# Patient Record
Sex: Female | Born: 1985 | Race: Black or African American | Hispanic: No | Marital: Single | Smoking: Current every day smoker
Health system: Southern US, Community
[De-identification: ages and names within clinical notes are randomized; demographics above are authoritative.]

## PROBLEM LIST (undated history)

## (undated) ENCOUNTER — Ambulatory Visit: Payer: Self-pay | Source: Home / Self Care

## (undated) DIAGNOSIS — F329 Major depressive disorder, single episode, unspecified: Secondary | ICD-10-CM

## (undated) DIAGNOSIS — F32A Depression, unspecified: Secondary | ICD-10-CM

---

## 2014-09-30 ENCOUNTER — Encounter (HOSPITAL_COMMUNITY): Payer: Self-pay | Admitting: Emergency Medicine

## 2014-09-30 ENCOUNTER — Emergency Department (HOSPITAL_COMMUNITY): Payer: Self-pay

## 2014-09-30 ENCOUNTER — Emergency Department (HOSPITAL_COMMUNITY)
Admission: EM | Admit: 2014-09-30 | Discharge: 2014-10-01 | Disposition: A | Discharge status: Home / Self Care | Payer: Self-pay | Attending: Emergency Medicine | Admitting: Emergency Medicine

## 2014-09-30 DIAGNOSIS — Y998 Other external cause status: Secondary | ICD-10-CM | POA: Insufficient documentation

## 2014-09-30 DIAGNOSIS — S82832A Other fracture of upper and lower end of left fibula, initial encounter for closed fracture: Secondary | ICD-10-CM | POA: Insufficient documentation

## 2014-09-30 DIAGNOSIS — S82402A Unspecified fracture of shaft of left fibula, initial encounter for closed fracture: Secondary | ICD-10-CM

## 2014-09-30 DIAGNOSIS — Y9351 Activity, roller skating (inline) and skateboarding: Secondary | ICD-10-CM | POA: Insufficient documentation

## 2014-09-30 DIAGNOSIS — Z880 Allergy status to penicillin: Secondary | ICD-10-CM | POA: Insufficient documentation

## 2014-09-30 DIAGNOSIS — Z8659 Personal history of other mental and behavioral disorders: Secondary | ICD-10-CM | POA: Insufficient documentation

## 2014-09-30 DIAGNOSIS — Y92331 Roller skating rink as the place of occurrence of the external cause: Secondary | ICD-10-CM | POA: Insufficient documentation

## 2014-09-30 HISTORY — DX: Depression, unspecified: F32.A

## 2014-09-30 HISTORY — DX: Major depressive disorder, single episode, unspecified: F32.9

## 2014-09-30 MED ORDER — IBUPROFEN 200 MG PO TABS
600.0000 mg | ORAL_TABLET | Freq: Once | ORAL | Status: AC
  Administered 2014-09-30: 600 mg via ORAL
  Filled 2014-09-30: qty 3

## 2014-09-30 NOTE — ED Notes (Signed)
Attempted to call ortho tech busy signal.

## 2014-09-30 NOTE — ED Notes (Addendum)
Spoke to [facility] about unable to contact ortho she reports that  They are here til 2330 and Tech at [facility] will need to be called. Secretary  Debbie called [facility].

## 2014-09-30 NOTE — ED Provider Notes (Addendum)
CSN: [id number]     Arrival date & time 09/30/14  2145 History  This chart was scribed for non-physician practitioner, Arman Filter, NP, working with Rolan Bucco, MD, by Budd Palmer ED Scribe. This patient was seen in room WTR6/WTR6 and the patient's care was started at 10:06 PM    Chief Complaint  Patient presents with  . Ankle Pain   The history is provided by the patient. No language interpreter was used.   HPI Comments: Diana Jimenez is a 29 y.o. female who presents to the Emergency Department complaining of left ankle pain onset after a fall 4-5 hours PTA. Pt states she was roller skating when she twisted the ankle and fell. She has applied ice and elevated the ankle. She has not taken any medication. She was unable to walk on it. She has sprained the ankle once before a age 76, when she was prescribed a brace and boot which she wore for a week.  Past Medical History  Diagnosis Date  . Depressed    History reviewed. No pertinent past surgical history. History reviewed. No pertinent family history. History  Substance Use Topics  . Smoking status: Not on file  . Smokeless tobacco: Not on file  . Alcohol Use: Not on file   OB History    No data available     Review of Systems  Allergies  Haldol and Penicillins  Home Medications   Prior to Admission medications   Medication Sig Start Date End Date Taking? Authorizing Provider  HYDROcodone-acetaminophen (NORCO/VICODIN) 5-325 MG per tablet Take 2 tablets by mouth every 4 (four) hours as needed. 10/01/14   Earley Favor, NP  ibuprofen (ADVIL,MOTRIN) 200 MG tablet Take 800 mg by mouth every 6 (six) hours as needed for moderate pain.   Yes Historical Provider, MD   BP 144/73 mmHg  Pulse 72  Temp(Src) 99 F (37.2 C) (Oral)  Resp 18  SpO2 95%  LMP 09/22/2014 Physical Exam  Constitutional: She is oriented to person, place, and time. She appears well-developed and well-nourished.  HENT:  Head: Normocephalic.  Eyes:  Pupils are equal, round, and reactive to light.  Neck: Normal range of motion.  Cardiovascular: Normal rate.   Musculoskeletal: She exhibits tenderness. She exhibits no edema.       Feet:  Neurological: She is alert and oriented to person, place, and time.  Skin: Skin is warm.  Nursing note and vitals reviewed.   ED Course  Procedures  DIAGNOSTIC STUDIES: Oxygen Saturation is 95% on RA, adequate by my interpretation.    COORDINATION OF CARE: 10:08 PM - Discussed plans to order diagnostic imaging. Pt advised of plan for treatment and pt agrees.  Labs Review Labs Reviewed - No data to display  Imaging Review Dg Ankle Complete Left  09/30/2014   CLINICAL DATA:  Trauma. Fall while roller-skating this afternoon. Pain and swelling laterally.  EXAM: LEFT ANKLE COMPLETE - 3+ VIEW  COMPARISON:  None.  FINDINGS: There is a mildly displaced oblique fracture of the distal fibula at the level of the tibial talar joint. No associated medial malleolar posterior tibial tubercle fracture. No widening of the medial clear space, mortise is preserved. Lateral soft tissue edema is seen.  IMPRESSION: Mildly displaced distal fibular fracture.   Electronically Signed   By: Rubye Oaks M.D.   On: 09/30/2014 23:17     EKG Interpretation None    i spoke with Dr. Annell Greening concerning the fracture that extends into the joint and  the mildly displaced status.  He states he would like a well-padded posterior splint, crutches, nonweightbearing and have the patient be seen by him in the office tomorrow.  This most likely is going to need a surgical plate for repair Patient has been placed in a Jones wrap with posterior splint, provided with crutches, orthopedic Tech taught proper crutch walking.  Patient demonstrated this back without difficulty.  She understands that she is to follow-up with Dr. Ophelia Charter in the morning  MDM   Final diagnoses:  Closed fibular fracture, left, initial encounter    I personally  performed the services described in this documentation, which was scribed in my presence. The recorded information has been reviewed and is accurate.  Earley Favor, NP 10/01/14 4098  Devoria Albe, MD 10/01/14 1191  Earley Favor, NP 10/21/14 4782  Devoria Albe, MD 10/27/14 2300

## 2014-09-30 NOTE — ED Notes (Signed)
Skating tonight, fell, injured left ankle. Moderate swelling observed, no obvious deformities.

## 2014-10-01 MED ORDER — HYDROCODONE-ACETAMINOPHEN 5-325 MG PO TABS
1.0000 | ORAL_TABLET | Freq: Once | ORAL | Status: AC
  Administered 2014-10-01: 1 via ORAL
  Filled 2014-10-01: qty 1

## 2014-10-01 MED ORDER — HYDROCODONE-ACETAMINOPHEN 5-325 MG PO TABS: 2.0000 | ORAL_TABLET | ORAL | Status: DC | PRN

## 2014-10-01 NOTE — ED Notes (Signed)
Pt significant other driving her home. She was advised to follow up with Dr. Ophelia Charter in the morning.

## 2014-10-01 NOTE — Discharge Instructions (Signed)
Cast or Splint Care °Casts and splints support injured limbs and keep bones from moving while they heal. It is important to care for your cast or splint at home.   °HOME CARE INSTRUCTIONS °· Keep the cast or splint uncovered during the drying period. It can take 24 to 48 hours to dry if it is made of plaster. A fiberglass cast will dry in less than 1 hour. °· Do not rest the cast on anything harder than a pillow for the first 24 hours. °· Do not put weight on your injured limb or apply pressure to the cast until your health care provider gives you permission. °· Keep the cast or splint dry. Wet casts or splints can lose their shape and may not support the limb as well. A wet cast that has lost its shape can also create harmful pressure on your skin when it dries. Also, wet skin can become infected. °¨ Cover the cast or splint with a plastic bag when bathing or when out in the rain or snow. If the cast is on the trunk of the body, take sponge baths until the cast is removed. °¨ If your cast does become wet, dry it with a towel or a blow dryer on the cool setting only. °· Keep your cast or splint clean. Soiled casts may be wiped with a moistened cloth. °· Do not place any hard or soft foreign objects under your cast or splint, such as cotton, toilet paper, lotion, or powder. °· Do not try to scratch the skin under the cast with any object. The object could get stuck inside the cast. Also, scratching could lead to an infection. If itching is a problem, use a blow dryer on a cool setting to relieve discomfort. °· Do not trim or cut your cast or remove padding from inside of it. °· Exercise all joints next to the injury that are not immobilized by the cast or splint. For example, if you have a long leg cast, exercise the hip joint and toes. If you have an arm cast or splint, exercise the shoulder, elbow, thumb, and fingers. °· Elevate your injured arm or leg on 1 or 2 pillows for the first 1 to 3 days to decrease  swelling and pain. It is best if you can comfortably elevate your cast so it is higher than your heart. °SEEK MEDICAL CARE IF:  °· Your cast or splint cracks. °· Your cast or splint is too tight or too loose. °· You have unbearable itching inside the cast. °· Your cast becomes wet or develops a soft spot or area. °· You have a bad smell coming from inside your cast. °· You get an object stuck under your cast. °· Your skin around the cast becomes red or raw. °· You have new pain or worsening pain after the cast has been applied. °SEEK IMMEDIATE MEDICAL CARE IF:  °· You have fluid leaking through the cast. °· You are unable to move your fingers or toes. °· You have discolored (blue or white), cool, painful, or very swollen fingers or toes beyond the cast. °· You have tingling or numbness around the injured area. °· You have severe pain or pressure under the cast. °· You have any difficulty with your breathing or have shortness of breath. °· You have chest pain. °Document Released: 02/17/2000 Document Revised: 12/10/2012 Document Reviewed: 08/28/2012 °ExitCare® Patient Information ©2015 [employer]. This information is not intended to replace advice given to you by your health care   provider. Make sure you discuss any questions you have with your health care provider. As discussed, you have a fracture.  That's very close to or into the joint making your ankle unstable.  Tonight, you have been placed in a splint and given crutches, you are NOT to bear weight on your left foot  I discussed this with Dr. Annell Greening-  He would like to see you in the office in the morning.  Please call between 8:30 and 9 to ascertain a time. This injury will most likely require surgery for repair

## 2014-10-01 NOTE — ED Notes (Signed)
Oroth at bedside.

## 2014-10-01 NOTE — ED Notes (Signed)
[facility] Bill called to report he will be on his way.

## 2014-10-20 ENCOUNTER — Encounter (HOSPITAL_COMMUNITY)
Admission: RE | Admit: 2014-10-20 | Discharge: 2014-10-20 | Disposition: A | Discharge status: Home / Self Care | Payer: Self-pay | Source: Ambulatory Visit | Attending: Orthopaedic Surgery | Admitting: Orthopaedic Surgery

## 2014-10-20 ENCOUNTER — Ambulatory Visit (HOSPITAL_COMMUNITY)
Admission: RE | Admit: 2014-10-20 | Discharge: 2014-10-20 | Disposition: A | Discharge status: Home / Self Care | Payer: Self-pay | Source: Ambulatory Visit | Attending: Surgery | Admitting: Surgery

## 2014-10-20 DIAGNOSIS — Z01818 Encounter for other preprocedural examination: Secondary | ICD-10-CM

## 2014-10-20 DIAGNOSIS — Z01812 Encounter for preprocedural laboratory examination: Secondary | ICD-10-CM | POA: Insufficient documentation

## 2014-10-20 DIAGNOSIS — I491 Atrial premature depolarization: Secondary | ICD-10-CM | POA: Insufficient documentation

## 2014-10-20 LAB — COMPREHENSIVE METABOLIC PANEL
ALT: 10 U/L — AB (ref 14–54)
ANION GAP: 8 (ref 5–15)
AST: 14 U/L — ABNORMAL LOW (ref 15–41)
Albumin: 4 g/dL (ref 3.5–5.0)
Alkaline Phosphatase: 70 U/L (ref 38–126)
BUN: 7 mg/dL (ref 6–20)
CHLORIDE: 105 mmol/L (ref 101–111)
CO2: 23 mmol/L (ref 22–32)
Calcium: 9.2 mg/dL (ref 8.9–10.3)
Creatinine, Ser: 0.81 mg/dL (ref 0.44–1.00)
GFR calc non Af Amer: >60 mL/min (ref >60)
Glucose, Bld: 98 mg/dL (ref 65–99)
Potassium: 3.8 mmol/L (ref 3.5–5.1)
SODIUM: 136 mmol/L (ref 135–145)
Total Bilirubin: 0.5 mg/dL (ref 0.3–1.2)
Total Protein: 7.3 g/dL (ref 6.5–8.1)

## 2014-10-20 LAB — CBC
HCT: 42.9 % (ref 36.0–46.0)
Hemoglobin: 14.9 g/dL (ref 12.0–15.0)
MCH: 33.1 pg (ref 26.0–34.0)
MCHC: 34.7 g/dL (ref 30.0–36.0)
MCV: 95.3 fL (ref 78.0–100.0)
Platelets: 237 10*3/uL (ref 150–400)
RBC: 4.5 MIL/uL (ref 3.87–5.11)
RDW: 12.7 % (ref 11.5–15.5)
WBC: 12.7 10*3/uL — ABNORMAL HIGH (ref 4.0–10.5)

## 2014-10-20 LAB — PROTIME-INR
INR: 1.12 (ref 0.00–1.49)
Prothrombin Time: 14.6 seconds (ref 11.6–15.2)

## 2014-10-20 LAB — SURGICAL PCR SCREEN
MRSA, PCR: NEGATIVE
Staphylococcus aureus: NEGATIVE

## 2014-10-20 LAB — APTT: aPTT: 32 seconds (ref 24–37)

## 2014-10-20 LAB — HCG, SERUM, QUALITATIVE: Preg, Serum: NEGATIVE

## 2014-10-20 NOTE — Progress Notes (Signed)
Pt denies SOB, chest pain, and being under the care of a cardiologist. Pt denies having a stress test, echo and cardiac cath. Pt denies having a chest x ray and EKG within the last year. 

## 2014-10-20 NOTE — Pre-Procedure Instructions (Signed)
Diana Jimenez  10/20/2014      [id number] [name], [name] - [name] [place] [address] Phone: [phone] Fax: [phone]    Your procedure is scheduled on Monday, October 25, 2014.  Report to [facility] Admitting at 1:45  P.M.  Call this number if you have problems the morning of surgery:  [phone]   Remember:  Do not eat food or drink liquids after midnight .  Wear  clean pajamas.            11.  Place clean sheets on your bed the night of your first shower and do not sleep with pets.  Day of Surgery  Do not apply any lotions/deodorants the morning of surgery.  Please wear clean clothes to the hospital/surgery center.  Please read over the following fact sheets that you were given. Pain Booklet, Coughing and Deep Breathing, MRSA Information and Surgical Site Infection Prevention

## 2014-10-21 NOTE — Progress Notes (Signed)
Anesthesia Chart Review:  Pt is 29 year old female scheduled for ORIF L fibula fracture on 10/25/2014 with Dr. Ophelia Charter.   PMH includes: depression. BMI 22. Smoking status not assessed.   Preoperative labs reviewed.    Chest x-ray 10/20/2014 reviewed. No active cardiopulmonary disease.   EKG 10/20/2014: Sinus rhythm with PACs. Early repolarization.  If no changes, I anticipate pt can proceed with surgery as scheduled.   Rica Mast, FNP-BC [facility]/Anesthesiology Phone: [phone] 10/21/2014 1:24 PM

## 2014-10-22 MED ORDER — CHLORHEXIDINE GLUCONATE 4 % EX LIQD: 60.0000 mL | Freq: Once | CUTANEOUS | Status: DC

## 2014-10-22 MED ORDER — VANCOMYCIN HCL IN DEXTROSE 1-5 GM/200ML-% IV SOLN
1000.0000 mg | INTRAVENOUS | Status: AC
  Administered 2014-10-25: 1000 mg via INTRAVENOUS
  Filled 2014-10-22: qty 200

## 2014-10-25 ENCOUNTER — Encounter (HOSPITAL_COMMUNITY)
Admission: RE | Disposition: A | Discharge status: Home / Self Care | Payer: Self-pay | Source: Ambulatory Visit | Attending: Orthopaedic Surgery

## 2014-10-25 ENCOUNTER — Ambulatory Visit (HOSPITAL_COMMUNITY)
Admission: RE | Admit: 2014-10-25 | Discharge: 2014-10-25 | Disposition: A | Discharge status: Home / Self Care | Payer: Self-pay | Source: Ambulatory Visit | Attending: Orthopaedic Surgery | Admitting: Orthopaedic Surgery

## 2014-10-25 ENCOUNTER — Ambulatory Visit (HOSPITAL_COMMUNITY): Payer: Self-pay | Admitting: Emergency Medicine

## 2014-10-25 ENCOUNTER — Encounter (HOSPITAL_COMMUNITY): Payer: Self-pay | Admitting: Surgery

## 2014-10-25 ENCOUNTER — Ambulatory Visit (HOSPITAL_COMMUNITY): Payer: Self-pay | Admitting: Anesthesiology

## 2014-10-25 DIAGNOSIS — F17210 Nicotine dependence, cigarettes, uncomplicated: Secondary | ICD-10-CM | POA: Insufficient documentation

## 2014-10-25 DIAGNOSIS — X58XXXA Exposure to other specified factors, initial encounter: Secondary | ICD-10-CM | POA: Insufficient documentation

## 2014-10-25 DIAGNOSIS — S8262XA Displaced fracture of lateral malleolus of left fibula, initial encounter for closed fracture: Secondary | ICD-10-CM | POA: Insufficient documentation

## 2014-10-25 DIAGNOSIS — F329 Major depressive disorder, single episode, unspecified: Secondary | ICD-10-CM | POA: Insufficient documentation

## 2014-10-25 DIAGNOSIS — Z880 Allergy status to penicillin: Secondary | ICD-10-CM | POA: Insufficient documentation

## 2014-10-25 DIAGNOSIS — Z885 Allergy status to narcotic agent status: Secondary | ICD-10-CM | POA: Insufficient documentation

## 2014-10-25 HISTORY — PX: ORIF FIBULA FRACTURE: SHX5114

## 2014-10-25 SURGERY — OPEN REDUCTION INTERNAL FIXATION (ORIF) FIBULA FRACTURE
Anesthesia: General | Site: Leg Lower | Laterality: Left

## 2014-10-25 MED ORDER — OXYCODONE HCL 5 MG/5ML PO SOLN: 5.0000 mg | Freq: Once | ORAL | Status: AC | PRN

## 2014-10-25 MED ORDER — OXYCODONE HCL 5 MG PO TABS
5.0000 mg | ORAL_TABLET | Freq: Once | ORAL | Status: AC | PRN
  Administered 2014-10-25: 5 mg via ORAL

## 2014-10-25 MED ORDER — LIDOCAINE HCL (CARDIAC) 20 MG/ML IV SOLN
INTRAVENOUS | Status: DC | PRN
  Administered 2014-10-25: 40 mg via INTRAVENOUS

## 2014-10-25 MED ORDER — DEXAMETHASONE SODIUM PHOSPHATE 4 MG/ML IJ SOLN
INTRAMUSCULAR | Status: DC | PRN
Start: 2014-10-25 — End: 2014-10-25
  Administered 2014-10-25: 4 mg via INTRAVENOUS

## 2014-10-25 MED ORDER — OXYCODONE-ACETAMINOPHEN 5-325 MG PO TABS: 2.0000 | ORAL_TABLET | ORAL | Status: DC | PRN

## 2014-10-25 MED ORDER — FENTANYL CITRATE (PF) 100 MCG/2ML IJ SOLN
25.0000 ug | INTRAMUSCULAR | Status: DC | PRN
  Administered 2014-10-25 (×4): 25 ug via INTRAVENOUS

## 2014-10-25 MED ORDER — FENTANYL CITRATE (PF) 100 MCG/2ML IJ SOLN
INTRAMUSCULAR | Status: DC | PRN
  Administered 2014-10-25 (×2): 50 ug via INTRAVENOUS
  Administered 2014-10-25: 25 ug via INTRAVENOUS
  Administered 2014-10-25: 50 ug via INTRAVENOUS
  Administered 2014-10-25: 25 ug via INTRAVENOUS
  Administered 2014-10-25: 50 ug via INTRAVENOUS

## 2014-10-25 MED ORDER — PROPOFOL 10 MG/ML IV BOLUS
INTRAVENOUS | Status: DC | PRN
  Administered 2014-10-25: 180 mg via INTRAVENOUS

## 2014-10-25 MED ORDER — MIDAZOLAM HCL 2 MG/2ML IJ SOLN
INTRAMUSCULAR | Status: AC
  Administered 2014-10-25: 2 mg
  Filled 2014-10-25: qty 2

## 2014-10-25 MED ORDER — FENTANYL CITRATE (PF) 100 MCG/2ML IJ SOLN
INTRAMUSCULAR | Status: AC
  Administered 2014-10-25: 100 ug
  Filled 2014-10-25: qty 2

## 2014-10-25 MED ORDER — MIDAZOLAM HCL 2 MG/2ML IJ SOLN
INTRAMUSCULAR | Status: AC
  Filled 2014-10-25: qty 2

## 2014-10-25 MED ORDER — 0.9 % SODIUM CHLORIDE (POUR BTL) OPTIME
TOPICAL | Status: DC | PRN
  Administered 2014-10-25: 1000 mL

## 2014-10-25 MED ORDER — GLYCOPYRROLATE 0.2 MG/ML IJ SOLN
INTRAMUSCULAR | Status: DC | PRN
  Administered 2014-10-25: 0.2 mg via INTRAVENOUS

## 2014-10-25 MED ORDER — FENTANYL CITRATE (PF) 250 MCG/5ML IJ SOLN
INTRAMUSCULAR | Status: AC
  Filled 2014-10-25: qty 5

## 2014-10-25 MED ORDER — BUPIVACAINE HCL (PF) 0.25 % IJ SOLN
INTRAMUSCULAR | Status: AC
  Filled 2014-10-25: qty 30

## 2014-10-25 MED ORDER — BUPIVACAINE HCL (PF) 0.25 % IJ SOLN
INTRAMUSCULAR | Status: DC | PRN
  Administered 2014-10-25: 10 mL

## 2014-10-25 MED ORDER — LIDOCAINE HCL (CARDIAC) 20 MG/ML IV SOLN
INTRAVENOUS | Status: AC
  Filled 2014-10-25: qty 5

## 2014-10-25 MED ORDER — ONDANSETRON HCL 4 MG/2ML IJ SOLN
INTRAMUSCULAR | Status: AC
  Filled 2014-10-25: qty 2

## 2014-10-25 MED ORDER — ONDANSETRON HCL 4 MG/2ML IJ SOLN: 4.0000 mg | Freq: Once | INTRAMUSCULAR | Status: DC | PRN

## 2014-10-25 MED ORDER — ONDANSETRON HCL 4 MG/2ML IJ SOLN
INTRAMUSCULAR | Status: DC | PRN
Start: 2014-10-25 — End: 2014-10-25
  Administered 2014-10-25: 4 mg via INTRAVENOUS

## 2014-10-25 MED ORDER — FENTANYL CITRATE (PF) 100 MCG/2ML IJ SOLN
INTRAMUSCULAR | Status: AC
  Filled 2014-10-25: qty 2

## 2014-10-25 MED ORDER — LACTATED RINGERS IV SOLN
INTRAVENOUS | Status: DC
  Administered 2014-10-25 (×2): via INTRAVENOUS

## 2014-10-25 MED ORDER — GLYCOPYRROLATE 0.2 MG/ML IJ SOLN
INTRAMUSCULAR | Status: AC
  Filled 2014-10-25: qty 1

## 2014-10-25 MED ORDER — OXYCODONE HCL 5 MG PO TABS
ORAL_TABLET | ORAL | Status: AC
  Filled 2014-10-25: qty 1

## 2014-10-25 SURGICAL SUPPLY — 63 items
BANDAGE ELASTIC 4 VELCRO ST LF (GAUZE/BANDAGES/DRESSINGS) IMPLANT
BANDAGE ELASTIC 6 VELCRO ST LF (GAUZE/BANDAGES/DRESSINGS) ×3 IMPLANT
BANDAGE ESMARK 6X9 LF (GAUZE/BANDAGES/DRESSINGS) ×1 IMPLANT
BIT DRILL 110X2.5XQCK CNCT (BIT) ×1 IMPLANT
BIT DRILL 2.5 (BIT) ×2
BIT DRL 110X2.5XQCK CNCT (BIT) ×1
BNDG ESMARK 6X9 LF (GAUZE/BANDAGES/DRESSINGS) ×3
BNDG GAUZE ELAST 4 BULKY (GAUZE/BANDAGES/DRESSINGS) IMPLANT
COVER MAYO STAND STRL (DRAPES) IMPLANT
COVER SURGICAL LIGHT HANDLE (MISCELLANEOUS) ×3 IMPLANT
CUFF TOURNIQUET SINGLE 34IN LL (TOURNIQUET CUFF) ×3 IMPLANT
CUFF TOURNIQUET SINGLE 44IN (TOURNIQUET CUFF) IMPLANT
DRAPE C-ARM 42X72 X-RAY (DRAPES) IMPLANT
DRAPE INCISE IOBAN 66X45 STRL (DRAPES) IMPLANT
DRAPE OEC MINIVIEW 54X84 (DRAPES) ×3 IMPLANT
DRAPE PROXIMA HALF (DRAPES) ×3 IMPLANT
DRAPE U-SHAPE 47X51 STRL (DRAPES) ×3 IMPLANT
DRSG PAD ABDOMINAL 8X10 ST (GAUZE/BANDAGES/DRESSINGS) ×3 IMPLANT
DURAPREP 26ML APPLICATOR (WOUND CARE) ×3 IMPLANT
ELECT REM PT RETURN 9FT ADLT (ELECTROSURGICAL) ×3
ELECTRODE REM PT RTRN 9FT ADLT (ELECTROSURGICAL) ×1 IMPLANT
GAUZE SPONGE 4X4 12PLY STRL (GAUZE/BANDAGES/DRESSINGS) ×3 IMPLANT
GAUZE XEROFORM 5X9 LF (GAUZE/BANDAGES/DRESSINGS) ×3 IMPLANT
GLOVE BIO SURGEON STRL SZ7 (GLOVE) ×9 IMPLANT
GLOVE BIOGEL PI IND STRL 6.5 (GLOVE) ×3 IMPLANT
GLOVE BIOGEL PI IND STRL 8 (GLOVE) ×2 IMPLANT
GLOVE BIOGEL PI INDICATOR 6.5 (GLOVE) ×6
GLOVE BIOGEL PI INDICATOR 8 (GLOVE) ×4
GLOVE ORTHO TXT STRL SZ7.5 (GLOVE) ×6 IMPLANT
GLOVE SURG SS PI 8.0 STRL IVOR (GLOVE) ×9 IMPLANT
GOWN STRL REUS W/ TWL LRG LVL3 (GOWN DISPOSABLE) ×3 IMPLANT
GOWN STRL REUS W/ TWL XL LVL3 (GOWN DISPOSABLE) ×3 IMPLANT
GOWN STRL REUS W/TWL 2XL LVL3 (GOWN DISPOSABLE) ×3 IMPLANT
GOWN STRL REUS W/TWL LRG LVL3 (GOWN DISPOSABLE) ×6
GOWN STRL REUS W/TWL XL LVL3 (GOWN DISPOSABLE) ×6
KIT BASIN OR (CUSTOM PROCEDURE TRAY) ×3 IMPLANT
KIT ROOM TURNOVER OR (KITS) ×3 IMPLANT
MANIFOLD NEPTUNE II (INSTRUMENTS) ×3 IMPLANT
NS IRRIG 1000ML POUR BTL (IV SOLUTION) ×3 IMPLANT
PACK ORTHO EXTREMITY (CUSTOM PROCEDURE TRAY) ×3 IMPLANT
PAD ARMBOARD 7.5X6 YLW CONV (MISCELLANEOUS) ×6 IMPLANT
PAD CAST 4YDX4 CTTN HI CHSV (CAST SUPPLIES) ×1 IMPLANT
PADDING CAST COTTON 4X4 STRL (CAST SUPPLIES) ×2
PADDING CAST COTTON 6X4 STRL (CAST SUPPLIES) ×3 IMPLANT
PLATE 7HOLE 1/3 TUBULAR (Plate) ×3 IMPLANT
SCREW CANC 2.5XFT 12X4XST SM (Screw) ×2 IMPLANT
SCREW CANC 4.0X12 (Screw) ×4 IMPLANT
SCREW CORTICAL 3.5 16MM (Screw) ×3 IMPLANT
SCREW CORTICAL 3.5X14 (Screw) ×9 IMPLANT
SCREW CORTICAL 3.5X26 (Screw) ×3 IMPLANT
SPONGE GAUZE 4X4 12PLY STER LF (GAUZE/BANDAGES/DRESSINGS) ×3 IMPLANT
SPONGE LAP 18X18 X RAY DECT (DISPOSABLE) ×3 IMPLANT
STAPLER VISISTAT 35W (STAPLE) IMPLANT
SUCTION FRAZIER TIP 10 FR DISP (SUCTIONS) ×3 IMPLANT
SUT ETHILON 3 0 PS 1 (SUTURE) ×6 IMPLANT
SUT VIC AB 2-0 CT1 27 (SUTURE) ×4
SUT VIC AB 2-0 CT1 TAPERPNT 27 (SUTURE) ×2 IMPLANT
TOWEL OR 17X24 6PK STRL BLUE (TOWEL DISPOSABLE) ×3 IMPLANT
TOWEL OR 17X26 10 PK STRL BLUE (TOWEL DISPOSABLE) ×3 IMPLANT
TUBE CONNECTING 12'X1/4 (SUCTIONS) ×1
TUBE CONNECTING 12X1/4 (SUCTIONS) ×2 IMPLANT
WATER STERILE IRR 1000ML POUR (IV SOLUTION) IMPLANT
YANKAUER SUCT BULB TIP NO VENT (SUCTIONS) ×3 IMPLANT

## 2014-10-25 NOTE — Anesthesia Procedure Notes (Addendum)
Anesthesia Regional Block:  Popliteal block  Pre-Anesthetic Checklist: ,, timeout performed, Correct Patient, Correct Site, Correct Laterality, Correct Procedure, Correct Position, site marked, Risks and benefits discussed,  Surgical consent,  Pre-op evaluation,  At surgeon's request and post-op pain management  Laterality: Left  Prep: Maximum Sterile Barrier Precautions used and chloraprep       Needles:  Injection technique: Single-shot  Needle Type: Echogenic Stimulator Needle     Needle Length: 9cm 9 cm Needle Gauge: 21 and 21 G    Additional Needles:  Procedures: ultrasound guided (picture in chart) and nerve stimulator Popliteal block Narrative:  Start time: 10/25/2014 3:30 PM End time: 10/25/2014 3:35 PM Injection made incrementally with aspirations every 5 mL.   Procedure Name: LMA Insertion Date/Time: 10/25/2014 4:02 PM Performed by: Renford Dills Pre-anesthesia Checklist: Patient identified, Emergency Drugs available, Suction available and Patient being monitored Patient Re-evaluated:Patient Re-evaluated prior to inductionOxygen Delivery Method: Circle system utilized Preoxygenation: Pre-oxygenation with 100% oxygen Intubation Type: IV induction Ventilation: Mask ventilation without difficulty LMA: LMA inserted LMA Size: 4.0 Number of attempts: 1 Placement Confirmation: positive ETCO2 and breath sounds checked- equal and bilateral Tube secured with: Tape Dental Injury: Teeth and Oropharynx as per pre-operative assessment

## 2014-10-25 NOTE — H&P (Signed)
Diana Jimenez is an 29 y.o. female.    A 29 year old, black female is a couple of weeks out from a left fibular fracture and returns.  She states she is doing well.  Obviously continues to have some discomfort in her ankle.  At last office visit, treatment options with immobilization and surgery were discussed.  The patient states that she does smoke cigarettes.     Past Medical History  Diagnosis Date  . Depressed     No past surgical history on file.  No family history on file. Social History:  has no tobacco, alcohol, and drug history on file.  Allergies:  Allergies  Allergen Reactions  . Haldol [Haloperidol Lactate]     States "mini seizures"  . Penicillins Itching    States "the worst yeast infections ever."    No prescriptions prior to admission    No results found for this or any previous visit (from the past 48 hour(s)). No results found.  Review of Systems  Constitutional: Negative.   HENT: Negative.   Respiratory: Negative.   Cardiovascular: Negative.   Gastrointestinal: Negative.   Genitourinary: Negative.   Musculoskeletal: Positive for joint pain.  Skin: Negative.   Psychiatric/Behavioral: Negative.     Last menstrual period 09/22/2014. Physical Exam  Constitutional: She is oriented to person, place, and time. She appears well-developed.  HENT:  Head: Normocephalic.  Eyes: Pupils are equal, round, and reactive to light.  Respiratory: No respiratory distress.  GI: She exhibits no distension.  Neurological: She is alert and oriented to person, place, and time.  Skin: Skin is warm and dry.  Psychiatric: She has a normal mood and affect.    PHYSICAL EXAMINATION:  A pleasant, black female.  Alert and oriented x3 and in no acute distress.  Her calf is intact.  No swelling.   RADIOGRAPHS:  X-rays again show slight displacement of her distal fibular fracture that is unchanged from previous visit.   ASSESSMENT:  Left distal fibular fracture.  History  of nicotine abuse.   PLAN:  Advised the patient that with the fracture that she has and her history of nicotine abuse, that the best treatment option at this point would be an ORIF procedure.  The surgical procedure along the potential rehab/recovery time discussed.  She understands that conservative treatment with just immobilization only may not be enough to heal the fracture, which could lead to a nonunion and the need for surgery months down the line. She voices understanding and will go ahead and schedule surgery within the next couple of weeks.  We discussed the potential postop risks and complications such as infection, nonunion, DVT, etc.  All questions answered.   Diana Jimenez M 10/25/2014, 4:42 AM

## 2014-10-25 NOTE — Discharge Instructions (Signed)
Elevate foot above heart. No weight bearing on right foot.  See Dr. Ophelia Charter in about one week . Call  [phone] for appt . If one is not already made. Take pain medication with food. May remove boot to apply ice or ice to lateral side of ankle.

## 2014-10-25 NOTE — Brief Op Note (Signed)
10/25/2014  4:50 PM  PATIENT:  Diana Jimenez  29 y.o. female  PRE-OPERATIVE DIAGNOSIS:  LEFT DISTAL FIBULA FRACTURE  POST-OPERATIVE DIAGNOSIS:  Left distal fibula fracture  PROCEDURE:  Procedure(s): OPEN REDUCTION INTERNAL FIXATION (ORIF) FIBULA FRACTURE (Left)  SURGEON:  Surgeon(s) and Role:    * Eldred Manges, MD - Primary  PHYSICIAN ASSISTANT:   ASSISTANTS: none   ANESTHESIA:   local, regional and general  EBL:  Total I/O In: 200 [I.V.:200] Out: -   BLOOD ADMINISTERED:none  DRAINS: none   LOCAL MEDICATIONS USED:  MARCAINE     SPECIMEN:  No Specimen  DISPOSITION OF SPECIMEN:  N/A  COUNTS:  YES  TOURNIQUET:    DICTATION: .Other Dictation: Dictation Number [id number]  PLAN OF CARE: Discharge to home after PACU  PATIENT DISPOSITION:  PACU - hemodynamically stable.   Delay start of Pharmacological VTE agent (>24hrs) due to surgical blood loss or risk of bleeding: not applicable

## 2014-10-25 NOTE — Interval H&P Note (Signed)
History and Physical Interval Note:  10/25/2014 2:45 PM  Diana Jimenez  has presented today for surgery, with the diagnosis of LEFT DISTAL FIBULA FRACTURE  The various methods of treatment have been discussed with the patient and family. After consideration of risks, benefits and other options for treatment, the patient has consented to  Procedure(s): OPEN REDUCTION INTERNAL FIXATION (ORIF) FIBULA FRACTURE (Left) as a surgical intervention .  The patient's history has been reviewed, patient examined, no change in status, stable for surgery.  I have reviewed the patient's chart and labs.  Questions were answered to the patient's satisfaction.     Jasani Lengel C   

## 2014-10-25 NOTE — Anesthesia Preprocedure Evaluation (Addendum)
Anesthesia Evaluation  Patient identified by MRN, date of birth, ID band Patient awake    Reviewed: Allergy & Precautions, NPO status , Patient's Chart, lab work & pertinent test results  History of Anesthesia Complications Negative for: history of anesthetic complications  Airway Mallampati: II  TM Distance: >3 FB Neck ROM: Full    Dental  (+) Teeth Intact, Dental Advisory Given   Pulmonary Current Smoker,  breath sounds clear to auscultation        Cardiovascular negative cardio ROS  Rhythm:Regular Rate:Normal     Neuro/Psych PSYCHIATRIC DISORDERS Depression negative neurological ROS     GI/Hepatic negative GI ROS, Neg liver ROS,   Endo/Other  negative endocrine ROS  Renal/GU negative Renal ROS     Musculoskeletal negative musculoskeletal ROS (+)   Abdominal   Peds  Hematology   Anesthesia Other Findings   Reproductive/Obstetrics negative OB ROS                           Anesthesia Physical Anesthesia Plan  ASA: II  Anesthesia Plan: General   Post-op Pain Management: GA combined w/ Regional for post-op pain   Induction: Intravenous  Airway Management Planned: LMA  Additional Equipment:   Intra-op Plan:   Post-operative Plan:   Informed Consent: I have reviewed the patients History and Physical, chart, labs and discussed the procedure including the risks, benefits and alternatives for the proposed anesthesia with the patient or authorized representative who has indicated his/her understanding and acceptance.   Dental advisory given  Plan Discussed with: CRNA and Anesthesiologist  Anesthesia Plan Comments:         Anesthesia Quick Evaluation

## 2014-10-25 NOTE — Transfer of Care (Signed)
Immediate Anesthesia Transfer of Care Note  Patient: Diana Jimenez  Procedure(s) Performed: Procedure(s): OPEN REDUCTION INTERNAL FIXATION (ORIF) FIBULA FRACTURE (Left)  Patient Location: PACU  Anesthesia Type:General  Level of Consciousness: awake and sedated  Airway & Oxygen Therapy: Patient Spontanous Breathing and Patient connected to nasal cannula oxygen  Post-op Assessment: Report given to RN and Post -op Vital signs reviewed and stable  Post vital signs: stable  Last Vitals:  Filed Vitals:   10/25/14 1417  BP: 122/88  Pulse: 83  Temp: 37 C  Resp: 18    Complications: No apparent anesthesia complications

## 2014-10-25 NOTE — Anesthesia Postprocedure Evaluation (Signed)
  Anesthesia Post-op Note  Patient: Diana Jimenez  Procedure(s) Performed: Procedure(s): OPEN REDUCTION INTERNAL FIXATION (ORIF) FIBULA FRACTURE (Left)  Patient Location: PACU  Anesthesia Type:General and GA combined with regional for post-op pain  Level of Consciousness: awake, alert  and oriented  Airway and Oxygen Therapy: Patient Spontanous Breathing and Patient connected to nasal cannula oxygen  Post-op Pain: none  Post-op Assessment: Post-op Vital signs reviewed, Patient's Cardiovascular Status Stable, Respiratory Function Stable, Patent Airway and Pain level controlled              Post-op Vital Signs: stable  Last Vitals:  Filed Vitals:   10/25/14 1706  BP: 132/90  Pulse: 78  Temp: 36.2 C  Resp: 13    Complications: No apparent anesthesia complications

## 2014-10-25 NOTE — Interval H&P Note (Signed)
History and Physical Interval Note:  10/25/2014 2:45 PM  Diana Jimenez  has presented today for surgery, with the diagnosis of LEFT DISTAL FIBULA FRACTURE  The various methods of treatment have been discussed with the patient and family. After consideration of risks, benefits and other options for treatment, the patient has consented to  Procedure(s): OPEN REDUCTION INTERNAL FIXATION (ORIF) FIBULA FRACTURE (Left) as a surgical intervention .  The patient's history has been reviewed, patient examined, no change in status, stable for surgery.  I have reviewed the patient's chart and labs.  Questions were answered to the patient's satisfaction.     Haroun Cotham C

## 2014-10-26 ENCOUNTER — Encounter (HOSPITAL_COMMUNITY): Payer: Self-pay | Admitting: Orthopaedic Surgery

## 2014-10-26 NOTE — Op Note (Signed)
NAME:  Diana Jimenez, Diana Jimenez            ACCOUNT NO.:  [id number]  MEDICAL RECORD NO.:  [id number]  LOCATION:  [facility]                         FACILITY:  [facility]  PHYSICIAN:  Cordale Manera C. Ophelia Charter, M.D.    DATE OF BIRTH:  [birth year]  DATE OF PROCEDURE:  10/25/2014 DATE OF DISCHARGE:  10/25/2014                              OPERATIVE REPORT   PREOPERATIVE DIAGNOSIS:  Displaced left lateral malleolar fracture.  POSTOPERATIVE DIAGNOSIS:  Displaced left lateral malleolar fracture.  PROCEDURE:  Open reduction and internal fixation of left lateral malleolus.  SURGEON:  Jimmye Wisnieski C. Ophelia Charter, M.D.  ANESTHESIA:  Preoperative popliteal block plus general plus 10 mL Marcaine local.  IMPLANTS:  Zimmer one-third tubular stainless steel 7-hole plate.  PROCEDURE IN DETAIL:  After induction of anesthesia, preoperative vancomycin which was still dripping in at the beginning of the procedure, prepping and draping, stockinette, extremity sheets and drapes.  No tourniquet was used since the vancomycin had not been completely infiltrated despite being started an hour and half earlier by Dr. Ophelia Charter in the preoperative area.  A time-out procedure was completed. Sterile skin marker was used laterally and lateral incision was made, subperiosteal dissection, distraction of the fracture, irrigation of the hematoma, reduction held with a self-retaining clamp.  A 7-hole one- third tubular plate was placed.  Proximal 4 screws were three 14-mm bicortical 3.5 screws and then one 16.  Distally two 12-mm fully threaded cancellous lag screws were placed unicortically.  C-arm was used and checked to make sure the plate was down the tip of the fibula fracture was well reduced.  Self-retaining clamp was removed and the lag screws were placed from anterior proximal to distal posteriorly which was a 26 mm screw.  Bridge just came through the posterior cortex lagging and tightening down the fracture securely.  Area was irrigated. At this  point, the vancomycin was in.  The tourniquet did not need to be insufflated.  Irrigation with saline solution. 2-0 Vicryl reapproximation of subcutaneous tissue, skin staple closure.  Postop soft dressing and then application of a CAM boot.  Once again, Xeroform, 4x4s, Webril, Ace wrap, and CAM boot were applied.  The patient tolerated the procedure well.  She will be discharged home.     Baby Gieger C. Ophelia Charter, M.D.     MCY/MEDQ  D:  10/25/2014  T:  10/26/2014  Job:  [id number]

## 2016-03-01 ENCOUNTER — Emergency Department (HOSPITAL_COMMUNITY)
Admission: EM | Admit: 2016-03-01 | Discharge: 2016-03-01 | Disposition: A | Discharge status: Home / Self Care | Payer: Self-pay | Attending: Emergency Medicine | Admitting: Emergency Medicine

## 2016-03-01 ENCOUNTER — Encounter (HOSPITAL_COMMUNITY): Payer: Self-pay | Admitting: Emergency Medicine

## 2016-03-01 DIAGNOSIS — K047 Periapical abscess without sinus: Secondary | ICD-10-CM | POA: Insufficient documentation

## 2016-03-01 DIAGNOSIS — F17210 Nicotine dependence, cigarettes, uncomplicated: Secondary | ICD-10-CM | POA: Insufficient documentation

## 2016-03-01 MED ORDER — PROBIOTIC DAILY PO CAPS: 1.0000 | ORAL_CAPSULE | Freq: Every day | ORAL | 0 refills | Status: DC

## 2016-03-01 MED ORDER — FLUCONAZOLE 200 MG PO TABS: 200.0000 mg | ORAL_TABLET | Freq: Once | ORAL | 0 refills | Status: DC | PRN

## 2016-03-01 MED ORDER — PENICILLIN V POTASSIUM 500 MG PO TABS: 500.0000 mg | ORAL_TABLET | Freq: Four times a day (QID) | ORAL | 0 refills | Status: AC

## 2016-03-01 NOTE — ED Provider Notes (Signed)
[facility]-EMERGENCY DEPT Provider Note   CSN: [id number] Arrival date & time: 03/01/16  1044   By signing my name below, I, Valentino SaxonBianca Contreras, attest that this documentation has been prepared under the direction and in the presence of Donivan Scullobyn Andren Bethea, NP. Electronically Signed: Valentino SaxonBianca Contreras, ED Scribe. 03/01/16. 11:14 AM.  History   Chief Complaint Chief Complaint  Patient presents with  . Dental Pain   The history is provided by the patient. No language interpreter was used.   HPI Comments: HPI Comments: Diana Jimenez is a 30 y.o. female who presents to the Emergency Department complaining L lower dental pain and swelling, onset 4 days ago, moderate, constant. Pt states she chipped a tooth on her left lower side about a month ago while eating and notes that she has noticed gradually worsening swelling to area. Pain described as a throbbing sensation, worse with chewing on affected side. Taken Advil with mild relief. Pt reports associated left facial swelling, + subjective fever/chills. Pt not currently established with a dentist. She notes having her right wisdom teeth removed ~6 months ago, no additional recent dental work. Denies gum bleeding, trismus, trouble swallowing, voice change, drooling,  appetite change, neck swelling, neck pain, ear pain, sore throat, CP, SOB, cough, hemoptysis, abdominal pain, nausea/vomiting/diarrhea, dysuria, extremity numbness/tingling, or any additional symptoms.   Past Medical History:  Diagnosis Date  . Depressed     There are no active problems to display for this patient.   Past Surgical History:  Procedure Laterality Date  . ORIF FIBULA FRACTURE Left 10/25/2014   Procedure: OPEN REDUCTION INTERNAL FIXATION (ORIF) FIBULA FRACTURE;  Surgeon: Eldred MangesMark C Yates, MD;  Location: [facility] OR;  Service: Orthopedics;  Laterality: Left;    OB History    No data available       Home Medications    Prior to Admission medications   Medication Sig Start Date  End Date Taking? Authorizing Provider  ibuprofen (ADVIL,MOTRIN) 200 MG tablet Take 800 mg by mouth every 6 (six) hours as needed for moderate pain.    Historical Provider, MD  oxyCODONE-acetaminophen (ROXICET) 5-325 MG per tablet Take 2 tablets by mouth every 4 (four) hours as needed for severe pain. 10/25/14   Eldred MangesMark C Yates, MD    Family History History reviewed. No pertinent family history.  Social History Social History  Substance Use Topics  . Smoking status: Current Every Day Smoker    Packs/day: 1.50    Years: 14.00    Types: Cigarettes  . Smokeless tobacco: Not on file  . Alcohol use No     Allergies   Haldol [haloperidol lactate] and Penicillins   Review of Systems Review of Systems  Constitutional: Positive for chills and fever. Negative for appetite change and unexpected weight change.  HENT: Positive for dental problem and facial swelling. Negative for drooling, ear discharge, ear pain, sore throat, trouble swallowing and voice change.   Respiratory: Negative for cough and shortness of breath.   Cardiovascular: Negative for chest pain.  Gastrointestinal: Negative for abdominal pain, diarrhea, nausea and vomiting.  Genitourinary: Negative for dysuria.  Musculoskeletal: Negative for back pain, neck pain and neck stiffness.  Skin: Negative for rash.  Neurological: Negative for dizziness, weakness and headaches.          All other systems reviewed and are negative.    Physical Exam Updated Vital Signs BP 137/84 (BP Location: Right Arm)   Pulse 82   Temp 98.3 F (36.8 C) (Oral)   Resp 20  SpO2 98%   Physical Exam  Constitutional: She is oriented to person, place, and time. She appears well-developed and well-nourished.  HENT:  Head: Normocephalic and atraumatic.  Right Ear: Tympanic membrane, external ear and ear canal normal.  Left Ear: Tympanic membrane, external ear and ear canal normal.  Nose: Nose normal. Right sinus exhibits no maxillary sinus  tenderness and no frontal sinus tenderness. Left sinus exhibits no maxillary sinus tenderness and no frontal sinus tenderness.  Mouth/Throat: Uvula is midline, oropharynx is clear and moist and mucous membranes are normal. No trismus in the jaw. Dental abscesses (to L lower first molar) present. No uvula swelling. No oropharyngeal exudate, posterior oropharyngeal edema, posterior oropharyngeal erythema or tonsillar abscesses. No tonsillar exudate.  Eyes: Conjunctivae are normal.  Neck: Normal range of motion and full passive range of motion without pain. Neck supple.  Cardiovascular: Normal rate, regular rhythm, normal heart sounds and intact distal pulses.   Pulmonary/Chest: Effort normal and breath sounds normal. No stridor. No respiratory distress. She has no wheezes. She has no rales.  Abdominal: Soft. Bowel sounds are normal. There is no tenderness.  Musculoskeletal: Normal range of motion.  Neurological: She is alert and oriented to person, place, and time.  Skin: Skin is warm and dry.  Psychiatric: She has a normal mood and affect. Her behavior is normal.  Nursing note and vitals reviewed.    ED Treatments / Results   DIAGNOSTIC STUDIES: Oxygen Saturation is 98% on RA, normal by my interpretation.    COORDINATION OF CARE: 11:06 AM Discussed treatment plan with pt at bedside which includes f/u with dentist and antibiotics and pt agreed to plan.   Labs (all labs ordered are listed, but only abnormal results are displayed) Labs Reviewed - No data to display  EKG  EKG Interpretation None       Radiology No results found.  Procedures Procedures (including critical care time)  Medications Ordered in ED Medications - No data to display   Initial Impression / Assessment and Plan / ED Course  I have reviewed the triage vital signs and the nursing notes.  Pertinent labs & imaging results that were available during my care of the patient were reviewed by me and considered  in my medical decision making (see chart for details).  Clinical Course    Pt is a 30 y/o F who presents to ED for dental abscess. No respiratory distress, no trismus, breathing with ease. Not c/w PTA at this time. Discussed listed PCN allergy, pt reports she has gotten a yeast infection in the past, no additional symptoms while taking this medication. Discussed pen vk rx and diflucan rx, pt agrees with plan, denies any additional concerns. Discussed discharge instructions, return precautions, and follow up; pt verbalizes understanding using verbal teachback.   Final Clinical Impressions(s) / ED Diagnoses   Final diagnoses:  None    New Prescriptions New Prescriptions   No medications on file   I personally performed the services described in this documentation, which was scribed in my presence. The recorded information has been reviewed and is accurate.    Albesa Seenobyn K Toia Micale, NP 03/01/16 1659    Eber HongBrian Miller, MD 03/01/16 [phone]

## 2016-03-01 NOTE — Discharge Instructions (Signed)
Take full course of Penicillin VK (antibiotic) as prescribed. Take probiotic daily while taking the antibiotic. Take Diflucan as prescribed if you experience symptoms of a yeast infection (vaginal itching, thick white vaginal itching, etc.)-feel free to ask your pharmacist if you have additional questions about your medications. Take Ibuprofen as prescribed for pain. Call to schedule an appointment with dental resources as soon as possible. Return to ER if you experience fevers, chills, unexplained weight loss, dizziness, facial/tongue/lip swelling, difficulty swallowing, difficulty opening mouth, difficulty eating/drinking, shortness of breath, cough, chest pain, abdominal pain, nausea/vomiting/diarrhea, worsening symptoms, or any additional concerns.

## 2016-03-01 NOTE — ED Triage Notes (Signed)
Pt sts left sided dental pain x 5 days with swelling

## 2016-07-22 ENCOUNTER — Encounter (HOSPITAL_COMMUNITY): Payer: Self-pay

## 2016-07-22 ENCOUNTER — Emergency Department (HOSPITAL_COMMUNITY)
Admission: EM | Admit: 2016-07-22 | Discharge: 2016-07-22 | Disposition: A | Discharge status: Home / Self Care | Payer: Self-pay | Attending: Emergency Medicine | Admitting: Emergency Medicine

## 2016-07-22 DIAGNOSIS — F17210 Nicotine dependence, cigarettes, uncomplicated: Secondary | ICD-10-CM | POA: Insufficient documentation

## 2016-07-22 DIAGNOSIS — K047 Periapical abscess without sinus: Secondary | ICD-10-CM | POA: Insufficient documentation

## 2016-07-22 MED ORDER — HYDROCODONE-ACETAMINOPHEN 5-325 MG PO TABS
1.0000 | ORAL_TABLET | Freq: Once | ORAL | Status: AC
  Administered 2016-07-22: 1 via ORAL
  Filled 2016-07-22: qty 1

## 2016-07-22 MED ORDER — NAPROXEN 500 MG PO TABS: 500.0000 mg | ORAL_TABLET | Freq: Two times a day (BID) | ORAL | 0 refills | Status: DC

## 2016-07-22 MED ORDER — CLINDAMYCIN HCL 150 MG PO CAPS
300.0000 mg | ORAL_CAPSULE | Freq: Once | ORAL | Status: AC
  Administered 2016-07-22: 300 mg via ORAL
  Filled 2016-07-22: qty 2

## 2016-07-22 MED ORDER — CLINDAMYCIN HCL 300 MG PO CAPS: 300.0000 mg | ORAL_CAPSULE | Freq: Three times a day (TID) | ORAL | 0 refills | Status: DC

## 2016-07-22 NOTE — ED Triage Notes (Signed)
Pt complaining of lower L dental pain. Pt states broken tooth. Pt denies any drainage. Pt afebrile at triage.

## 2016-07-22 NOTE — ED Provider Notes (Signed)
[facility]-EM[facility]Y [facility] Provider Note   CSN: [id number] Arrival date & time: 07/22/16  1632 By signing my name below, I, Diana Jimenez, attest that this documentation has been prepared under the direction and in the presence of Diana Jimenez M. Damian Leavell, FNP.  Electronically Signed: Diona Jimenez, ED Scribe. 07/22/16. 5:43 PM.  History   Chief Complaint Chief Complaint  Patient presents with  . Dental Pain    HPI Diana Jimenez is a 31 y.o. female who presents to the Emergency Department complaining of gradually worsening, severe, lower left dental pain that started ~ 3 days. Pt has had a broken tooth for sometime, however pain was bearable until about 3 days ago. She rates her pain a 9/10 severity. Associated sx include left sided facial swelling, and left ear pain. Pain is exacerbated when chewing with left side of her mouth. About 1 year ago she had another tooth removed by vocational rehab in [facility]. She currently is unemployed and is unable to go to the pervious [employer]. She smokes, but is trying to quit. Pt denies discharge, and fever.  The history is provided by the patient. No language interpreter was used.  Dental Pain   This is a recurrent problem. The current episode started more than 2 days ago. The problem occurs constantly. The problem has been gradually worsening. The pain is at a severity of 9/10. The pain is severe.    Past Medical History:  Diagnosis Date  . Depressed     There are no active problems to display for this patient.   Past Surgical History:  Procedure Laterality Date  . ORIF FIBULA FRACTURE Left 10/25/2014   Procedure: OPEN REDUCTION INTERNAL FIXATION (ORIF) FIBULA FRACTURE;  Surgeon: Eldred Manges, MD;  Location: [facility] OR;  Service: Orthopedics;  Laterality: Left;    OB History    No data available       Home Medications    Prior to Admission medications   Medication Sig Start Date End Date Taking? Authorizing Provider  clindamycin (CLEOCIN) 300  MG capsule Take 1 capsule (300 mg total) by mouth 3 (three) times daily. 07/22/16   Janne Napoleon, NP  fluconazole (DIFLUCAN) 200 MG tablet Take 1 tablet (200 mg total) by mouth once as needed. If needed for yeast infection 03/01/16   Wojeck, Hinton Dyer, NP  ibuprofen (ADVIL,MOTRIN) 200 MG tablet Take 800 mg by mouth every 6 (six) hours as needed for moderate pain.    [provider]  naproxen (NAPROSYN) 500 MG tablet Take 1 tablet (500 mg total) by mouth 2 (two) times daily. 07/22/16   Janne Napoleon, NP  oxyCODONE-acetaminophen (ROXICET) 5-325 MG per tablet Take 2 tablets by mouth every 4 (four) hours as needed for severe pain. 10/25/14   Eldred Manges, MD  Probiotic Product (PROBIOTIC DAILY) CAPS Take 1 capsule by mouth daily. 03/01/16   Wojeck, Hinton Dyer, NP    Family History History reviewed. No pertinent family history.  Social History Social History  Substance Use Topics  . Smoking status: Current Some Day Smoker    Packs/day: 1.50    Years: 14.00    Types: Cigarettes  . Smokeless tobacco: Never Used  . Alcohol use No     Allergies   Haldol [haloperidol lactate] and Penicillins   Review of Systems Review of Systems  Constitutional: Negative for fever.  HENT: Positive for dental problem, ear pain and facial swelling. Negative for trouble swallowing.   Respiratory: Negative for cough.  Gastrointestinal: Negative for nausea and vomiting.  Musculoskeletal: Negative for neck stiffness.  Skin: Negative for rash.  Allergic/Immunologic: Negative for immunocompromised state.  Hematological: Positive for adenopathy.  Psychiatric/Behavioral: The patient is not nervous/anxious.      Physical Exam Updated Vital Signs BP 118/85   Pulse 73   Temp 98.7 F (37.1 C) (Oral)   Resp 16   LMP 07/19/2016 (Approximate)   SpO2 100%   Physical Exam  Constitutional: She appears well-developed and well-nourished. No distress.  HENT:  Head: Normocephalic and atraumatic.  Right Ear:  External ear normal.  Left Ear: External ear normal.  Mouth/Throat: Oropharynx is clear and moist.  Uvula is midline, no erythema, no edema. Left lower 1st molar decayed to gum line.  Swelling and erythema to area surrounding the tooth. Tender on palpation.  Anterior cervical lymph nodes enlarged and tender.   Eyes: Conjunctivae and EOM are normal.  Neck: Neck supple.  Cardiovascular: Normal rate and regular rhythm.   Pulmonary/Chest: Effort normal and breath sounds normal.  Lungs are clear.  Abdominal: Soft. There is no tenderness.  Musculoskeletal: Normal range of motion.  Lymphadenopathy:    She has cervical adenopathy.  Neurological: She is alert.  Skin: Skin is warm and dry.  Psychiatric: She has a normal mood and affect. Her behavior is normal.  Nursing note and vitals reviewed.    ED Treatments / Results  DIAGNOSTIC STUDIES: Oxygen Saturation is 100% on RA, normal by my interpretation.   COORDINATION OF CARE: 5:39 PM-Discussed next steps with pt. Pt verbalized understanding and is agreeable with the plan.    Labs (all labs ordered are listed, but only abnormal results are displayed) Labs Reviewed - No data to display   Radiology No results found.  Procedures Procedures (including critical care time)  Medications Ordered in ED Medications  clindamycin (CLEOCIN) capsule 300 mg (300 mg Oral Given 07/22/16 1749)  HYDROcodone-acetaminophen (NORCO/VICODIN) 5-325 MG per tablet 1 tablet (1 tablet Oral Given 07/22/16 1749)     Initial Impression / Assessment and Plan / ED Course  I have reviewed the triage vital signs and the nursing notes.  Final Clinical Impressions(s) / ED Diagnoses  31 y.o. female with dental pain stable for d/c without concern for Ludwig's angina or pharyngeal abscess. Will treat with antibiotics and have her follow up with a dentist as soon as possible. Dental resource guide given.   Final diagnoses:  Dental infection    New  Prescriptions Discharge Medication List as of 07/22/2016  5:42 PM    START taking these medications   Details  clindamycin (CLEOCIN) 300 MG capsule Take 1 capsule (300 mg total) by mouth 3 (three) times daily., Starting Sun 07/22/2016, Print    naproxen (NAPROSYN) 500 MG tablet Take 1 tablet (500 mg total) by mouth 2 (two) times daily., Starting Sun 07/22/2016, Print       I personally performed the services described in this documentation, which was scribed in my presence. The recorded information has been reviewed and is accurate.    Kerrie Buffaloeese, Hope PalmerM, [place]NP 07/22/16 Claria [id number]    Benjiman CorePickering, Nathan, MD 07/22/16 1843

## 2016-07-22 NOTE — ED Notes (Signed)
Pt stable, ambulatory, states understanding of discharge instructions 

## 2016-11-12 DIAGNOSIS — I469 Cardiac arrest, cause unspecified: Secondary | ICD-10-CM

## 2017-08-03 ENCOUNTER — Emergency Department (HOSPITAL_COMMUNITY): Payer: No Typology Code available for payment source

## 2017-08-03 ENCOUNTER — Emergency Department (HOSPITAL_COMMUNITY)
Admission: EM | Admit: 2017-08-03 | Discharge: 2017-08-03 | Disposition: A | Discharge status: Home / Self Care | Payer: No Typology Code available for payment source | Attending: Emergency Medicine | Admitting: Emergency Medicine

## 2017-08-03 ENCOUNTER — Encounter (HOSPITAL_COMMUNITY): Payer: Self-pay

## 2017-08-03 ENCOUNTER — Other Ambulatory Visit: Payer: Self-pay

## 2017-08-03 DIAGNOSIS — M545 Low back pain, unspecified: Secondary | ICD-10-CM

## 2017-08-03 DIAGNOSIS — Y999 Unspecified external cause status: Secondary | ICD-10-CM | POA: Diagnosis not present

## 2017-08-03 DIAGNOSIS — Y939 Activity, unspecified: Secondary | ICD-10-CM | POA: Insufficient documentation

## 2017-08-03 DIAGNOSIS — Z79899 Other long term (current) drug therapy: Secondary | ICD-10-CM | POA: Diagnosis not present

## 2017-08-03 DIAGNOSIS — R0789 Other chest pain: Secondary | ICD-10-CM

## 2017-08-03 DIAGNOSIS — F17210 Nicotine dependence, cigarettes, uncomplicated: Secondary | ICD-10-CM | POA: Insufficient documentation

## 2017-08-03 DIAGNOSIS — Y929 Unspecified place or not applicable: Secondary | ICD-10-CM | POA: Insufficient documentation

## 2017-08-03 LAB — POC URINE PREG, ED: Preg Test, Ur: NEGATIVE

## 2017-08-03 MED ORDER — CYCLOBENZAPRINE HCL 10 MG PO TABS: 10.0000 mg | ORAL_TABLET | Freq: Two times a day (BID) | ORAL | 0 refills | Status: DC | PRN

## 2017-08-03 MED ORDER — IBUPROFEN 600 MG PO TABS: 600.0000 mg | ORAL_TABLET | Freq: Four times a day (QID) | ORAL | 0 refills | Status: DC | PRN

## 2017-08-03 MED ORDER — ACETAMINOPHEN 500 MG PO TABS: 500.0000 mg | ORAL_TABLET | Freq: Four times a day (QID) | ORAL | 0 refills | Status: DC | PRN

## 2017-08-03 NOTE — Discharge Instructions (Addendum)
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
## 2019-02-08 ENCOUNTER — Ambulatory Visit (HOSPITAL_COMMUNITY)
Admission: EM | Admit: 2019-02-08 | Discharge: 2019-02-08 | Disposition: A | Discharge status: Home / Self Care | Payer: Self-pay | Attending: Urgent Care | Admitting: Urgent Care

## 2019-02-08 ENCOUNTER — Encounter (HOSPITAL_COMMUNITY): Payer: Self-pay

## 2019-02-08 DIAGNOSIS — R222 Localized swelling, mass and lump, trunk: Secondary | ICD-10-CM

## 2019-02-08 MED ORDER — DICLOFENAC SODIUM 1 % EX GEL: 2.0000 g | Freq: Three times a day (TID) | CUTANEOUS | 0 refills | Status: DC

## 2019-02-08 NOTE — ED Triage Notes (Signed)
Pt presents with a cyst or possible swollen lymph node on right lower quadrant that developed a few weeks ago, pt also reports some nausea

## 2019-02-08 NOTE — ED Provider Notes (Signed)
[facility]   MRN: [id number] DOB: [birth year]  Subjective:   Diana Jimenez is a 33 y.o. female presenting for 1 to 2-week history of nodule/mass over her right lower abdomen.  Patient states that she noticed it after Thanksgiving week at the end of November.  She cannot recall if she injured herself but she does notice a bruise was there.  She has not had any kind of pain otherwise, redness or skin changes.  Denies fevers, nausea, vomiting.  Patient is concerned that she has cancer.  No current facility-administered medications for this encounter.   Current Outpatient Medications:  .  acetaminophen (TYLENOL) 500 MG tablet, Take 1 tablet (500 mg total) by mouth every 6 (six) hours as needed., Disp: 30 tablet, Rfl: 0 .  clindamycin (CLEOCIN) 300 MG capsule, Take 1 capsule (300 mg total) by mouth 3 (three) times daily., Disp: 30 capsule, Rfl: 0 .  cyclobenzaprine (FLEXERIL) 10 MG tablet, Take 1 tablet (10 mg total) by mouth 2 (two) times daily as needed for muscle spasms., Disp: 10 tablet, Rfl: 0 .  fluconazole (DIFLUCAN) 200 MG tablet, Take 1 tablet (200 mg total) by mouth once as needed. If needed for yeast infection, Disp: 1 tablet, Rfl: 0 .  ibuprofen (ADVIL,MOTRIN) 600 MG tablet, Take 1 tablet (600 mg total) by mouth every 6 (six) hours as needed., Disp: 30 tablet, Rfl: 0 .  naproxen (NAPROSYN) 500 MG tablet, Take 1 tablet (500 mg total) by mouth 2 (two) times daily., Disp: 20 tablet, Rfl: 0 .  oxyCODONE-acetaminophen (ROXICET) 5-325 MG per tablet, Take 2 tablets by mouth every 4 (four) hours as needed for severe pain., Disp: 50 tablet, Rfl: 0 .  Probiotic Product (PROBIOTIC DAILY) CAPS, Take 1 capsule by mouth daily., Disp: 30 capsule, Rfl: 0   Allergies  Allergen Reactions  . Haldol [Haloperidol Lactate]     States "mini seizures"  . Penicillins Itching    States "the worst yeast infections ever."    Past Medical History:  Diagnosis Date  . Depressed      Past  Surgical History:  Procedure Laterality Date  . ORIF FIBULA FRACTURE Left 10/25/2014   Procedure: OPEN REDUCTION INTERNAL FIXATION (ORIF) FIBULA FRACTURE;  Surgeon: Eldred Manges, MD;  Location: [facility] OR;  Service: Orthopedics;  Laterality: Left;    History reviewed. No pertinent family history.  Social History   Tobacco Use  . Smoking status: Current Some Day Smoker    Packs/day: 1.50    Years: 14.00    Pack years: 21.00    Types: Cigarettes  . Smokeless tobacco: Never Used  Substance Use Topics  . Alcohol use: No  . Drug use: No    ROS   Objective:   Vitals: BP 126/80   Pulse 60   Temp 97.9 F (36.6 C)   Resp 20   LMP 01/12/2019 (Approximate)   SpO2 98%   Physical Exam Constitutional:      General: She is not in acute distress.    Appearance: Normal appearance. She is well-developed. She is not ill-appearing, toxic-appearing or diaphoretic.  HENT:     Head: Normocephalic and atraumatic.     Nose: Nose normal.     Mouth/Throat:     Mouth: Mucous membranes are moist.     Pharynx: Oropharynx is clear.  Eyes:     General: No scleral icterus.    Extraocular Movements: Extraocular movements intact.     Pupils: Pupils are equal, round, and reactive to light.  Cardiovascular:     Rate and Rhythm: Normal rate.  Pulmonary:     Effort: Pulmonary effort is normal.  Abdominal:     General: Bowel sounds are normal. There is no distension.     Palpations: Abdomen is soft. There is mass (~1/2cm firm mobile superficial mass over RLQ/flank side).     Tenderness: There is no abdominal tenderness. There is no guarding or rebound.  Skin:    General: Skin is warm and dry.  Neurological:     General: No focal deficit present.     Mental Status: She is alert and oriented to person, place, and time.  Psychiatric:        Mood and Affect: Mood normal.        Behavior: Behavior normal.        Thought Content: Thought content normal.        Judgment: Judgment normal.       Assessment and Plan :   1. Mass of skin of abdomen     Suspect lipoma versus resolving contusion.  We will have patient use diclofenac as she has had minimal tenderness.  Anticipatory guidance provided.  Counseled on signs and symptoms warranting a recheck. Counseled patient on potential for adverse effects with medications prescribed/recommended today, ER and return-to-clinic precautions discussed, patient verbalized understanding.    Jaynee Eagles, PA-C 02/08/19 1306

## 2019-02-08 NOTE — Discharge Instructions (Signed)
If you develop skin changes, discoloration, bleeding over the area we are managing for lipoma versus contusion then come back for a recheck. Otherwise use diclofenac as needed for pain and inflammation.

## 2019-08-10 ENCOUNTER — Ambulatory Visit: Payer: Self-pay | Attending: Internal Medicine

## 2019-08-10 DIAGNOSIS — Z23 Encounter for immunization: Secondary | ICD-10-CM

## 2019-08-10 NOTE — Progress Notes (Signed)
° °  Covid-19 Vaccination Clinic  Name:  [name]    MRN: [id number] DOB: [birth year]  08/10/2019  [name] was observed post Covid-19 immunization for 15 minutes without incident. She was provided with Vaccine Information Sheet and instruction to access the V-Safe system.   [name] was instructed to call 911 with any severe reactions post vaccine:  Difficulty breathing   Swelling of face and throat   A fast heartbeat   A bad rash all over body   Dizziness and weakness   Immunizations Administered    Name Date Dose VIS Date Route   Pfizer COVID-19 Vaccine 08/10/2019  4:28 PM 0.3 mL 04/29/2018 Intramuscular   Manufacturer: [employer], [employer]   Lot: [id number]   NDC: [address]-2

## 2019-08-31 ENCOUNTER — Ambulatory Visit: Payer: No Typology Code available for payment source | Attending: Internal Medicine

## 2020-01-17 ENCOUNTER — Other Ambulatory Visit: Payer: Self-pay

## 2020-01-17 ENCOUNTER — Emergency Department (HOSPITAL_COMMUNITY)
Admission: EM | Admit: 2020-01-17 | Discharge: 2020-01-17 | Disposition: A | Discharge status: Home / Self Care | Payer: Self-pay | Attending: Emergency Medicine | Admitting: Emergency Medicine

## 2020-01-17 ENCOUNTER — Encounter (HOSPITAL_COMMUNITY): Payer: Self-pay | Admitting: Obstetrics and Gynecology

## 2020-01-17 DIAGNOSIS — G8929 Other chronic pain: Secondary | ICD-10-CM | POA: Insufficient documentation

## 2020-01-17 DIAGNOSIS — M546 Pain in thoracic spine: Secondary | ICD-10-CM | POA: Insufficient documentation

## 2020-01-17 DIAGNOSIS — F17210 Nicotine dependence, cigarettes, uncomplicated: Secondary | ICD-10-CM | POA: Insufficient documentation

## 2020-01-17 MED ORDER — METHOCARBAMOL 500 MG PO TABS: 500.0000 mg | ORAL_TABLET | Freq: Three times a day (TID) | ORAL | 0 refills | Status: DC | PRN

## 2020-01-17 NOTE — ED Triage Notes (Signed)
Patient reports to the ER for pinched nerve in her neck and lower back pain.  Patient denies urinary symptoms

## 2020-01-17 NOTE — ED Provider Notes (Signed)
[facility]-EMERGENCY DEPT Provider Note   CSN: [id number] Arrival date & time: 01/17/20  1224     History Chief Complaint  Patient presents with  . Back Pain    Diana Jimenez is a 34 y.o. female with no pertinent past medical history who presents today for evaluation of about a month of pain in her left-sided upper back.  She reports that at work she lifts heavy objects frequently.  She states that her pain in the left side of her back has been worsening.  She reports that over the past 3 days it has increased.  She denies any weakness or numbness.  No changes to bowel or bladder function.  She denies any personal history of cancer or IV drug use/abuse.  No recent fevers.  She denies any cough, chest pain, shortness of breath.  No hormone use.  No leg swelling.  No hemoptysis, no recent surgeries or immobilizations.  No history of PE/DVT.  She has not been diagnosed with a pinched nerve before.  She tried 800 mg of ibuprofen once a week ago which allowed her to sleep however did not resolve her pain.  She took 2 pills of Tylenol in the past few days once which provided mild temporary relief of her pain.  HPI     Past Medical History:  Diagnosis Date  . Depressed     There are no problems to display for this patient.   Past Surgical History:  Procedure Laterality Date  . ORIF FIBULA FRACTURE Left 10/25/2014   Procedure: OPEN REDUCTION INTERNAL FIXATION (ORIF) FIBULA FRACTURE;  Surgeon: Eldred Manges, MD;  Location: [facility] OR;  Service: Orthopedics;  Laterality: Left;     OB History   No obstetric history on file.     No family history on file.  Social History   Tobacco Use  . Smoking status: Current Some Day Smoker    Packs/day: 1.50    Years: 14.00    Pack years: 21.00    Types: Cigarettes  . Smokeless tobacco: Never Used  Substance Use Topics  . Alcohol use: Yes    Comment: 6 a week  . Drug use: No    Home Medications Prior to Admission  medications   Medication Sig Start Date End Date Taking? Authorizing Provider  acetaminophen (TYLENOL) 500 MG tablet Take 1 tablet (500 mg total) by mouth every 6 (six) hours as needed. 08/03/17   Fawze, Mina A, PA-C  diclofenac Sodium (VOLTAREN) 1 % GEL Apply 2 g topically 3 (three) times daily. 02/08/19   Wallis Bamberg, PA-C  ibuprofen (ADVIL,MOTRIN) 600 MG tablet Take 1 tablet (600 mg total) by mouth every 6 (six) hours as needed. 08/03/17   Fawze, Mina A, PA-C  methocarbamol (ROBAXIN) 500 MG tablet Take 1 tablet (500 mg total) by mouth every 8 (eight) hours as needed for muscle spasms. 01/17/20   Cristina Gong, PA-C  Probiotic Product (PROBIOTIC DAILY) CAPS Take 1 capsule by mouth daily. 03/01/16   Wojeck, Hinton Dyer, NP    Allergies    Haldol [haloperidol lactate] and Penicillins  Review of Systems   Review of Systems  Constitutional: Negative for chills, fatigue, fever and unexpected weight change.  Eyes: Negative for visual disturbance.  Respiratory: Negative for cough, chest tightness and shortness of breath.   Cardiovascular: Negative for chest pain, palpitations and leg swelling.  Gastrointestinal: Negative for abdominal pain, diarrhea, nausea and vomiting.  Genitourinary: Negative for difficulty urinating and frequency.  Musculoskeletal:  Positive for back pain. Negative for neck pain.  Skin: Negative for color change, rash and wound.  Neurological: Negative for dizziness, weakness, numbness and headaches.  Psychiatric/Behavioral: Negative for confusion.  All other systems reviewed and are negative.   Physical Exam Updated Vital Signs BP (!) 147/91 (BP Location: Left Arm)   Pulse 83   Temp 98.2 F (36.8 C) (Oral)   Resp 15   Ht 5\' 5"  (1.651 m)   Wt 67.1 kg   LMP 01/16/2020   SpO2 100%   BMI 24.63 kg/m   Physical Exam Vitals and nursing note reviewed.  Constitutional:      General: She is not in acute distress.    Appearance: She is well-developed. She is not  diaphoretic.  HENT:     Head: Normocephalic and atraumatic.  Eyes:     General: No scleral icterus.       Right eye: No discharge.        Left eye: No discharge.     Conjunctiva/sclera: Conjunctivae normal.  Cardiovascular:     Rate and Rhythm: Normal rate and regular rhythm.     Pulses: Normal pulses.     Heart sounds: Normal heart sounds. No murmur heard.   Pulmonary:     Effort: Pulmonary effort is normal. No respiratory distress.     Breath sounds: Normal breath sounds. No stridor.  Abdominal:     General: There is no distension.     Tenderness: There is no abdominal tenderness.  Musculoskeletal:        General: No deformity.     Cervical back: Normal range of motion and neck supple.     Right lower leg: No edema.     Left lower leg: No edema.     Comments: No midline C/T/L-spine tenderness palpation, step-offs or deformities.  He has diffuse left-sided thoracic paraspinal muscle tenderness to palpation.  Palpation here both recreates and exacerbates her reported pain.  Additionally pain is worsened and exacerbated with movements of the shoulders bilaterally, left worse than right.  Skin:    General: Skin is warm and dry.  Neurological:     General: No focal deficit present.     Mental Status: She is alert.     Motor: No weakness or abnormal muscle tone.     Comments: 5/5 strength bilateral arms.  Sensation intact to light touch to bilateral arms.  Psychiatric:        Mood and Affect: Mood normal.        Behavior: Behavior normal.     ED Results / Procedures / Treatments   Labs (all labs ordered are listed, but only abnormal results are displayed) Labs Reviewed - No data to display  EKG None  Radiology No results found.  Procedures Procedures (including critical care time)  Medications Ordered in ED Medications - No data to display  ED Course  I have reviewed the triage vital signs and the nursing notes.  Pertinent labs & imaging results that were  available during my care of the patient were reviewed by me and considered in my medical decision making (see chart for details).    MDM Rules/Calculators/A&P                         Patient presents today for evaluation of approximately 1 month of left-sided thoracic back pain that worsened over the past 3 days.  No fevers.  Lungs are clear to auscultation bilaterally and she  denies any current pulmonary or cardiac symptoms or history of.  She is PERC negative.  No fevers.  No personal history of cancer or drug abuse.  She is neurovascularly intact on my exam.  Her pain appears to be musculoskeletal in nature.  She has tried ibuprofen once and Tylenol once each over the past week however has not consistently been having any NSAIDs or attempted specific treatment.  Recommended conservative care including NSAIDs.  She is given a work note and placed on light duty for 2 weeks as she lifts heavy objects frequently at work.  Doubt intrathoracic cause for her symptoms.  Recommended PCP follow-up.  Return precautions were discussed with patient who states their understanding.  At the time of discharge patient denied any unaddressed complaints or concerns.  Patient is agreeable for discharge home.  Note: Portions of this report may have been transcribed using voice recognition software. Every effort was made to ensure accuracy; however, inadvertent computerized transcription errors may be present   Final Clinical Impression(s) / ED Diagnoses Final diagnoses:  Chronic left-sided thoracic back pain    Rx / DC Orders ED Discharge Orders         Ordered    methocarbamol (ROBAXIN) 500 MG tablet  Every 8 hours PRN        01/17/20 1345           Kerkhoff Clay 01/17/20 1404    Milagros Loll, MD 01/17/20 2053

## 2020-01-17 NOTE — Discharge Instructions (Signed)

## 2020-05-19 ENCOUNTER — Emergency Department (HOSPITAL_COMMUNITY)
Admission: EM | Admit: 2020-05-19 | Discharge: 2020-05-19 | Disposition: A | Discharge status: Home / Self Care | Payer: Self-pay | Attending: Emergency Medicine | Admitting: Emergency Medicine

## 2020-05-19 ENCOUNTER — Encounter (HOSPITAL_COMMUNITY): Payer: Self-pay | Admitting: *Deleted

## 2020-05-19 DIAGNOSIS — X500XXA Overexertion from strenuous movement or load, initial encounter: Secondary | ICD-10-CM | POA: Insufficient documentation

## 2020-05-19 DIAGNOSIS — F17210 Nicotine dependence, cigarettes, uncomplicated: Secondary | ICD-10-CM | POA: Insufficient documentation

## 2020-05-19 DIAGNOSIS — M5442 Lumbago with sciatica, left side: Secondary | ICD-10-CM | POA: Insufficient documentation

## 2020-05-19 DIAGNOSIS — Y990 Civilian activity done for income or pay: Secondary | ICD-10-CM | POA: Insufficient documentation

## 2020-05-19 LAB — POC URINE PREG, ED: Preg Test, Ur: NEGATIVE

## 2020-05-19 MED ORDER — NAPROXEN 375 MG PO TABS: 375.0000 mg | ORAL_TABLET | Freq: Two times a day (BID) | ORAL | 0 refills | Status: AC

## 2020-05-19 MED ORDER — LIDOCAINE 5 % EX PTCH: 1.0000 | MEDICATED_PATCH | CUTANEOUS | 0 refills | Status: DC

## 2020-05-19 MED ORDER — PREDNISONE 10 MG PO TABS: 40.0000 mg | ORAL_TABLET | Freq: Every day | ORAL | 0 refills | Status: AC

## 2020-05-19 MED ORDER — METHOCARBAMOL 500 MG PO TABS: 500.0000 mg | ORAL_TABLET | Freq: Two times a day (BID) | ORAL | 0 refills | Status: AC

## 2020-05-19 NOTE — ED Triage Notes (Signed)
Pt complains of left lower back pain. She reports lifting heavy objects at work and a history of lower back pain.

## 2020-05-19 NOTE — Discharge Instructions (Addendum)
At this time there does not appear to be the presence of an emergent medical condition, however there is always the potential for conditions to change. Please read and follow the below instructions.  Please return to the Emergency Department immediately for any new or worsening symptoms. Please be sure to follow up with your Primary Care Provider within one week regarding your visit today; please call their office to schedule an appointment even if you are feeling better for a follow-up visit.  If you do not have a primary care provider please call the number under [facility] to establish 1. You have been prescribed an NSAID-containing medication called Naproxen today.  Do not take the medications including ibuprofen, Aleve, Advil or other NSAID-containing medications while taking Naproxen.  Please be sure to drink enough water.   You may also take over-the-counter Tylenol as directed the packaging to help with your symptoms. You may use the Lidoderm patch as prescribed to help with your symptoms.  Lidoderm may be expensive so you may speak with your pharmacist about finding over-the-counter medications that work similarly. You may use the muscle relaxer Robaxin as prescribed to help with your symptoms.  Do not drive or operate heavy machinery while taking Robaxin as it will make you drowsy.  Do not drink alcohol or take other sedating medications while taking Robaxin as this will worsen side effects. You may use the medication prednisone as prescribed to help with sciatica symptoms.  Go to the nearest Emergency Department immediately if: You have fever or chills You cannot control when you pee (urinate) or poop (have a bowel movement). You have weakness in any of these areas and it gets worse: Lower back. The area between your hip bones. Butt. Legs. You have redness or swelling of your back. You have a burning feeling when you pee. You have any new/concerning or  worsening of symptoms.   Please read the additional information packets attached to your discharge summary.  Do not take your medicine if  develop an itchy rash, swelling in your mouth or lips, or difficulty breathing; call 911 and seek immediate emergency medical attention if this occurs.  You may review your lab tests and imaging results in their entirety on your MyChart account.  Please discuss all results of fully with your primary care provider and other specialist at your follow-up visit.  Note: Portions of this text may have been transcribed using voice recognition software. Every effort was made to ensure accuracy; however, inadvertent computerized transcription errors may still be present.
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
## 2021-01-10 ENCOUNTER — Emergency Department (HOSPITAL_COMMUNITY)
Admission: EM | Admit: 2021-01-10 | Discharge: 2021-01-10 | Disposition: A | Discharge status: Home / Self Care | Payer: Self-pay | Attending: Emergency Medicine | Admitting: Emergency Medicine

## 2021-01-10 DIAGNOSIS — M545 Low back pain, unspecified: Secondary | ICD-10-CM | POA: Insufficient documentation

## 2021-01-10 DIAGNOSIS — M79604 Pain in right leg: Secondary | ICD-10-CM | POA: Insufficient documentation

## 2021-01-10 DIAGNOSIS — M79605 Pain in left leg: Secondary | ICD-10-CM | POA: Insufficient documentation

## 2021-01-10 DIAGNOSIS — M62838 Other muscle spasm: Secondary | ICD-10-CM

## 2021-01-10 DIAGNOSIS — M62830 Muscle spasm of back: Secondary | ICD-10-CM

## 2021-01-10 DIAGNOSIS — F17210 Nicotine dependence, cigarettes, uncomplicated: Secondary | ICD-10-CM | POA: Insufficient documentation

## 2021-01-10 MED ORDER — CYCLOBENZAPRINE HCL 5 MG PO TABS: 5.0000 mg | ORAL_TABLET | Freq: Three times a day (TID) | ORAL | 0 refills | Status: AC | PRN

## 2021-01-10 NOTE — ED Provider Notes (Signed)
[facility] EMERGENCY DEPARTMENT Provider Note   CSN: [id number] Arrival date & time: 01/10/21  0720     History Chief Complaint  Patient presents with   Back Pain   Leg Pain    Diana Jimenez is a 35 y.o. female.  Presented with new onset cramping lower back pain predominantly in her lower back, which she also feels in her right hamstring.  She reports that she has been evaluated for this pain in the past and was told that if she felt any pain in her legs to return to the emergency department.  She endorses paraspinal muscle tenderness which also is cramping in sensation.  No spinal tenderness or pain.  She is not having any numbness or tingling and no reported weakness.  No changes in bowel or bladder habits.  Denies history of trauma.  No IV drug use.  No fever chills nausea vomiting diarrhea.   Back Pain Associated symptoms: leg pain   Leg Pain Associated symptoms: back pain   Associated symptoms: no neck pain       Past Medical History:  Diagnosis Date   Depressed     There are no problems to display for this patient.   Past Surgical History:  Procedure Laterality Date   ORIF FIBULA FRACTURE Left 10/25/2014   Procedure: OPEN REDUCTION INTERNAL FIXATION (ORIF) FIBULA FRACTURE;  Surgeon: Eldred Manges, MD;  Location: [facility] OR;  Service: Orthopedics;  Laterality: Left;     OB History   No obstetric history on file.     No family history on file.  Social History   Tobacco Use   Smoking status: Some Days    Packs/day: 1.50    Years: 14.00    Pack years: 21.00    Types: Cigarettes   Smokeless tobacco: Never  Substance Use Topics   Alcohol use: Yes    Comment: 6 a week   Drug use: No    Home Medications Prior to Admission medications   Medication Sig Start Date End Date Taking? Authorizing Provider  lidocaine (LIDODERM) 5 % Place 1 patch onto the skin daily. Remove & Discard patch within 12 hours or as directed by MD 05/19/20   Harlene Salts A, PA-C  Probiotic Product (PROBIOTIC DAILY) CAPS Take 1 capsule by mouth daily. 03/01/16   Wojeck, Hinton Dyer, NP    Allergies    Haldol [haloperidol lactate] and Penicillins  Review of Systems   Review of Systems  Constitutional: Negative.   HENT: Negative.    Eyes: Negative.   Respiratory: Negative.    Cardiovascular: Negative.   Gastrointestinal: Negative.   Genitourinary: Negative.   Musculoskeletal:  Positive for back pain. Negative for gait problem, joint swelling, myalgias, neck pain and neck stiffness.  Neurological: Negative.   Hematological: Negative.   Psychiatric/Behavioral: Negative.     Physical Exam Updated Vital Signs BP 123/86   Pulse 73   Temp 98.5 F (36.9 C) (Oral)   Resp 18   LMP 12/25/2020   SpO2 100%   Physical Exam Constitutional:      General: She is not in acute distress.    Appearance: Normal appearance. She is normal weight. She is not ill-appearing, toxic-appearing or diaphoretic.  HENT:     Head: Normocephalic and atraumatic.  Cardiovascular:     Rate and Rhythm: Normal rate and regular rhythm.     Pulses: Normal pulses.     Heart sounds: Normal heart sounds.  Pulmonary:  Effort: Pulmonary effort is normal.     Breath sounds: Normal breath sounds.  Abdominal:     General: Abdomen is flat. Bowel sounds are normal.     Palpations: Abdomen is soft.  Musculoskeletal:        General: No swelling, tenderness, deformity or signs of injury. Normal range of motion.     Cervical back: Normal range of motion and neck supple. No rigidity or tenderness.     Right lower leg: No edema.     Left lower leg: No edema.  Skin:    General: Skin is warm and dry.  Neurological:     General: No focal deficit present.     Mental Status: She is alert and oriented to person, place, and time. Mental status is at baseline.     Cranial Nerves: No cranial nerve deficit.     Sensory: No sensory deficit.     Motor: No weakness.     Coordination:  Coordination normal.     Gait: Gait normal.     Deep Tendon Reflexes: Reflexes normal.  Psychiatric:        Mood and Affect: Mood normal.        Behavior: Behavior normal.    ED Results / Procedures / Treatments   Labs (all labs ordered are listed, but only abnormal results are displayed) Labs Reviewed - No data to display  EKG None  Radiology No results found.  Procedures Procedures   Medications Ordered in ED Medications - No data to display  ED Course  I have reviewed the triage vital signs and the nursing notes.  Pertinent labs & imaging results that were available during my care of the patient were reviewed by me and considered in my medical decision making (see chart for details).    MDM Rules/Calculators/A&P                           Patient presents with cramping pain in her back.  She has been evaluated for this in the past and was told that if she felt pain in her legs to present to the emergency department. Pain described as cramping in nature she is not having any numbness tingling or weakness.  Cranial nerves intact, no saddle anesthesia, no changes in bowel or bladder habit.  There is no numbness or tingling in her upper or lower extremities.  No weakness in upper or lower extremities.  Coordination is grossly intact.  Straight leg raise negative.  She does endorse a cramping pain on the right leg.  She is also having paraspinal muscle tenderness that is tender to palpation.  No midline tenderness. No concern for neurologic injury at this point.  Believe her pain likely due to muscle spasm, will treat with short course of Flexeril and discharged home.  Final Clinical Impression(s) / ED Diagnoses Final diagnoses:  None    Rx / DC Orders ED Discharge Orders     None        Adron Bene, MD 01/10/21 1042    Jacalyn Lefevre, MD 01/10/21 1506

## 2021-01-10 NOTE — ED Triage Notes (Signed)
Pt. Stated, I started having lower back pain going down my legs

## 2021-01-18 ENCOUNTER — Ambulatory Visit
Admission: EM | Admit: 2021-01-18 | Discharge: 2021-01-18 | Disposition: A | Discharge status: Home / Self Care | Payer: Self-pay | Attending: Physician Assistant | Admitting: Physician Assistant

## 2021-01-18 ENCOUNTER — Other Ambulatory Visit: Payer: Self-pay

## 2021-01-18 ENCOUNTER — Ambulatory Visit (INDEPENDENT_AMBULATORY_CARE_PROVIDER_SITE_OTHER): Payer: Self-pay

## 2021-01-18 ENCOUNTER — Encounter: Payer: Self-pay | Admitting: Emergency Medicine

## 2021-01-18 DIAGNOSIS — M542 Cervicalgia: Secondary | ICD-10-CM

## 2021-01-18 DIAGNOSIS — M5441 Lumbago with sciatica, right side: Secondary | ICD-10-CM

## 2021-01-18 MED ORDER — TIZANIDINE HCL 4 MG PO TABS: 4.0000 mg | ORAL_TABLET | Freq: Four times a day (QID) | ORAL | 0 refills | Status: DC | PRN

## 2021-01-18 MED ORDER — PREDNISONE 20 MG PO TABS: 40.0000 mg | ORAL_TABLET | Freq: Every day | ORAL | 0 refills | Status: AC

## 2021-01-18 NOTE — ED Triage Notes (Signed)
Patient c/o neck pain that radiates to left shoulder, radiates down back and legs.  Patient was given Flexeril by the hospital w/minimal relief.

## 2021-01-18 NOTE — ED Provider Notes (Signed)
[facility]    CSN: [id number] Arrival date & time: 01/18/21  1722      History   Chief Complaint Chief Complaint  Patient presents with   Neck Pain    HPI Diana Jimenez is a 35 y.o. female.   Patient here today for evaluation of neck pain that will radiate to her shoulder as well as lower back pain that radiates to her right leg.  She reports that she has cramping in her right leg often.  Sitting seems to make symptoms worse.  She has tried over-the-counter medication without significant relief.  She was also recently prescribed muscle relaxer without improvement.  The history is provided by the patient.  Neck Pain Associated symptoms: no fever    Past Medical History:  Diagnosis Date   Depressed     There are no problems to display for this patient.   Past Surgical History:  Procedure Laterality Date   ORIF FIBULA FRACTURE Left 10/25/2014   Procedure: OPEN REDUCTION INTERNAL FIXATION (ORIF) FIBULA FRACTURE;  Surgeon: Eldred Manges, MD;  Location: [facility] OR;  Service: Orthopedics;  Laterality: Left;    OB History   No obstetric history on file.      Home Medications    Prior to Admission medications   Medication Sig Start Date End Date Taking? Authorizing Provider  lidocaine (LIDODERM) 5 % Place 1 patch onto the skin daily. Remove & Discard patch within 12 hours or as directed by MD 05/19/20  Yes Harlene Salts A, PA-C  predniSONE (DELTASONE) 20 MG tablet Take 2 tablets (40 mg total) by mouth daily with breakfast for 5 days. 01/18/21 01/23/21 Yes Tomi Bamberger, PA-C  tiZANidine (ZANAFLEX) 4 MG tablet Take 1 tablet (4 mg total) by mouth every 6 (six) hours as needed for muscle spasms. 01/18/21  Yes Tomi Bamberger, PA-C  Probiotic Product (PROBIOTIC DAILY) CAPS Take 1 capsule by mouth daily. 03/01/16   Wojeck, Hinton Dyer, NP    Family History No family history on file.  Social History Social History   Tobacco Use   Smoking status: Some Days     Packs/day: 1.50    Years: 14.00    Pack years: 21.00    Types: Cigarettes   Smokeless tobacco: Never  Substance Use Topics   Alcohol use: Yes    Comment: 6 a week   Drug use: No     Allergies   Haldol [haloperidol lactate] and Penicillins   Review of Systems Review of Systems  Constitutional:  Negative for chills and fever.  Eyes:  Negative for discharge and redness.  Respiratory:  Negative for shortness of breath.   Musculoskeletal:  Positive for back pain and neck pain.    Physical Exam Triage Vital Signs ED Triage Vitals  Enc Vitals Group     BP 01/18/21 1750 131/85     Pulse Rate 01/18/21 1750 79     Resp --      Temp 01/18/21 1750 98.4 F (36.9 C)     Temp Source 01/18/21 1750 Oral     SpO2 01/18/21 1750 99 %     Weight 01/18/21 1752 150 lb (68 kg)     Height 01/18/21 1752 5\' 5"  (1.651 m)     Head Circumference --      Peak Flow --      Pain Score 01/18/21 1751 10     Pain Loc --      Pain Edu? --  Excl. in GC? --    No data found.  Updated Vital Signs BP 131/85 (BP Location: Left Arm)   Pulse 79   Temp 98.4 F (36.9 C) (Oral)   Ht 5\' 5"  (1.651 m)   Wt 150 lb (68 kg)   LMP 12/25/2020   SpO2 99%   BMI 24.96 kg/m   Physical Exam Vitals and nursing note reviewed.  Constitutional:      General: She is not in acute distress.    Appearance: Normal appearance. She is not ill-appearing.  HENT:     Head: Normocephalic and atraumatic.     Mouth/Throat:     Pharynx: Oropharynx is clear.  Eyes:     Conjunctiva/sclera: Conjunctivae normal.  Cardiovascular:     Rate and Rhythm: Normal rate.  Pulmonary:     Effort: Pulmonary effort is normal.  Musculoskeletal:     Comments: Mild ttp to cervical spine and lower back bilaterally  Neurological:     Mental Status: She is alert.     UC Treatments / Results  Labs (all labs ordered are listed, but only abnormal results are displayed) Labs Reviewed - No data to display  EKG   Radiology DG  Cervical Spine Complete  Result Date: 01/18/2021 CLINICAL DATA:  Neck pain that radiates to the left shoulder. EXAM: CERVICAL SPINE - COMPLETE 4+ VIEW COMPARISON:  None. FINDINGS: There is no evidence of cervical spine fracture or prevertebral soft tissue swelling. Reversal of the normal cervical lordosis is seen. Alignment is otherwise normal. No other significant bone abnormalities are identified. IMPRESSION: 1. No acute osseous abnormality. 2. Reversal of the normal cervical lordosis, which may be in part positional. Electronically Signed   By: 01/20/2021 M.D.   On: 01/18/2021 18:22    Procedures Procedures (including critical care time)  Medications Ordered in UC Medications - No data to display  Initial Impression / Assessment and Plan / UC Course  I have reviewed the triage vital signs and the nursing notes.  Pertinent labs & imaging results that were available during my care of the patient were reviewed by me and considered in my medical decision making (see chart for details).    Xray without significant finding. Will treat with steroid and a different muscle relaxer. Recommended follow up with PCP as she may be a great candidate for PT or may need further imaging in the future.   Final Clinical Impressions(s) / UC Diagnoses   Final diagnoses:  Neck pain  Acute bilateral low back pain with right-sided sciatica   Discharge Instructions   None    ED Prescriptions     Medication Sig Dispense Auth. Provider   predniSONE (DELTASONE) 20 MG tablet Take 2 tablets (40 mg total) by mouth daily with breakfast for 5 days. 10 tablet 01/20/2021 F, PA-C   tiZANidine (ZANAFLEX) 4 MG tablet Take 1 tablet (4 mg total) by mouth every 6 (six) hours as needed for muscle spasms. 30 tablet M, PA-C      PDMP not reviewed this encounter.   Tomi Bamberger, PA-C 01/18/21 [phone]

## 2021-08-22 ENCOUNTER — Other Ambulatory Visit: Payer: Self-pay

## 2021-08-22 ENCOUNTER — Emergency Department (HOSPITAL_COMMUNITY)
Admission: EM | Admit: 2021-08-22 | Discharge: 2021-08-22 | Disposition: A | Discharge status: Home / Self Care | Payer: Self-pay | Attending: Emergency Medicine | Admitting: Emergency Medicine

## 2021-08-22 ENCOUNTER — Encounter (HOSPITAL_COMMUNITY): Payer: Self-pay | Admitting: Emergency Medicine

## 2021-08-22 DIAGNOSIS — L03012 Cellulitis of left finger: Secondary | ICD-10-CM | POA: Insufficient documentation

## 2021-08-22 MED ORDER — HYDROCODONE-ACETAMINOPHEN 5-325 MG PO TABS
1.0000 | ORAL_TABLET | Freq: Once | ORAL | Status: AC
  Administered 2021-08-22: 1 via ORAL
  Filled 2021-08-22: qty 1

## 2021-08-22 NOTE — ED Provider Notes (Signed)
[facility] EMERGENCY DEPARTMENT Provider Note   CSN: [id number] Arrival date & time: 08/22/21  1009     History  Chief Complaint  Patient presents with   Wound Infection    Diana Jimenez is a 36 y.o. female.  36 year old female with no past medical history presents to the ED with a chief complaint of left thumb pain for the past 3 days.  Patient reports she does pick at her nails, began to notice some swelling along with pus coming from the nail cuticle, she has tried warm soaks, she is been taking ibuprofen for pain without any improvement.  Exacerbated with any touching or ranging of the left thumb.  Patient is without any fevers, no other complaints.  No prior history of paronychias.  Nondiabetic.  The history is provided by the patient.       Home Medications Prior to Admission medications   Medication Sig Start Date End Date Taking? Authorizing Provider  lidocaine (LIDODERM) 5 % Place 1 patch onto the skin daily. Remove & Discard patch within 12 hours or as directed by MD 05/19/20   Harlene Salts A, PA-C  Probiotic Product (PROBIOTIC DAILY) CAPS Take 1 capsule by mouth daily. 03/01/16   Wojeck, Hinton Dyer, NP  tiZANidine (ZANAFLEX) 4 MG tablet Take 1 tablet (4 mg total) by mouth every 6 (six) hours as needed for muscle spasms. 01/18/21   Tomi Bamberger, PA-C      Allergies    Haldol [haloperidol lactate] and Penicillins    Review of Systems   Review of Systems  Constitutional:  Negative for fever.  Skin:  Positive for wound.    Physical Exam Updated Vital Signs BP (!) 137/91 (BP Location: Left Arm)   Pulse 99   Temp 99.1 F (37.3 C) (Oral)   Resp 14   SpO2 99%  Physical Exam Vitals and nursing note reviewed.  Constitutional:      Appearance: Normal appearance.  HENT:     Head: Normocephalic and atraumatic.  Eyes:     Pupils: Pupils are equal, round, and reactive to light.  Cardiovascular:     Rate and Rhythm: Normal rate.   Pulmonary:     Effort: Pulmonary effort is normal.  Abdominal:     General: Abdomen is flat.  Musculoskeletal:     Left hand: Swelling and tenderness present. Normal strength. Normal sensation. There is no disruption of two-point discrimination. Normal capillary refill. Normal pulse.     Cervical back: Normal range of motion and neck supple.     Comments: Left thumb swelling noted, with pus and erythema surrounding.   Skin:    General: Skin is warm and dry.  Neurological:     Mental Status: She is alert and oriented to person, place, and time.     ED Results / Procedures / Treatments   Labs (all labs ordered are listed, but only abnormal results are displayed) Labs Reviewed - No data to display  EKG None  Radiology No results found.  Procedures .Marland KitchenIncision and Drainage  Date/Time: 08/22/2021 1:09 PM  Performed by: Claude Manges, PA-C Authorized by: Claude Manges, PA-C   Consent:    Consent obtained:  Verbal   Consent given by:  Patient   Risks discussed:  Incomplete drainage, bleeding and infection Universal protocol:    Patient identity confirmed:  Verbally with patient Location:    Type:  Abscess   Location:  Upper extremity   Upper extremity location:  Finger  Finger location:  L thumb Sedation:    Sedation type:  None Anesthesia:    Anesthesia method:  None Procedure type:    Complexity:  Simple Procedure details:    Incision types:  Single straight   Incision depth:  Dermal   Drainage:  Purulent   Drainage amount:  Copious   Wound treatment:  Wound left open Post-procedure details:    Procedure completion:  Tolerated well, no immediate complications     Medications Ordered in ED Medications  HYDROcodone-acetaminophen (NORCO/VICODIN) 5-325 MG per tablet 1 tablet (1 tablet Oral Given 08/22/21 1246)    ED Course/ Medical Decision Making/ A&P                           Medical Decision Making Risk Prescription drug management.    Patient here  with paronychia for the last couple of days without any improvement in symptoms despite warm soaks.  Patient agreeable to I&D procedure in the ED.  Tolerated the procedure well, given Norco for pain control.  Large amount of drainage.  Wound was left open. Patient was stable for dispo. No prior hx of DM, not suppressed, I do not feel inclined to prescribe antibiotics at this time.  Strict return precautions given.     Portions of this note were generated with Scientist, clinical (histocompatibility and immunogenetics). Dictation errors may occur despite best attempts at proofreading.   Final Clinical Impression(s) / ED Diagnoses Final diagnoses:  Paronychia of left thumb    Rx / DC Orders ED Discharge Orders     None         Claude Manges, PA-C 08/22/21 1311    Milagros Loll, MD 08/22/21 [phone]

## 2021-08-22 NOTE — Discharge Instructions (Addendum)
Please keep your wound clean and dry.  You may alternate Tylenol and ibuprofen to help with your pain.

## 2021-08-22 NOTE — ED Provider Triage Note (Signed)
Emergency Medicine Provider Triage Evaluation Note  Diana Jimenez , a 36 y.o. female  was evaluated in triage.  Pt complains of paronychia for the past 3 days.  Has been taking ibuprofen along with warm soaks without any improvement in symptoms..  Review of Systems  Positive: Wound Negative: fever  Physical Exam  BP (!) 137/91 (BP Location: Left Arm)   Pulse 99   Temp 99.1 F (37.3 C) (Oral)   Resp 14   SpO2 99%  Gen:   Awake, no distress   Resp:  Normal effort  MSK:   Moves extremities without difficulty  Other:  Large paronychia to the right thumb  Medical Decision Making  Medically screening exam initiated at 10:18 AM.  Appropriate orders placed.  Diana Jimenez was informed that the remainder of the evaluation will be completed by another provider, this initial triage assessment does not replace that evaluation, and the importance of remaining in the ED until their evaluation is complete.  Nondiabetic here with paronychia, afebrile.Will need I&D.   Claude Manges, PA-C 08/22/21 1022

## 2021-08-22 NOTE — ED Triage Notes (Signed)
Pt states she has had a wound on her right thumb for 3 days. It started on the side by her fingernail. It is not open, but looks like it has pus inside.

## 2022-02-05 ENCOUNTER — Encounter (HOSPITAL_COMMUNITY): Payer: Self-pay | Admitting: Emergency Medicine

## 2022-02-05 ENCOUNTER — Emergency Department (HOSPITAL_COMMUNITY): Payer: Medicaid Other

## 2022-02-05 ENCOUNTER — Emergency Department (HOSPITAL_COMMUNITY)
Admission: EM | Admit: 2022-02-05 | Discharge: 2022-02-05 | Disposition: A | Discharge status: Home / Self Care | Payer: Medicaid Other | Attending: Emergency Medicine | Admitting: Emergency Medicine

## 2022-02-05 DIAGNOSIS — M545 Low back pain, unspecified: Secondary | ICD-10-CM | POA: Diagnosis not present

## 2022-02-05 DIAGNOSIS — X501XXA Overexertion from prolonged static or awkward postures, initial encounter: Secondary | ICD-10-CM | POA: Insufficient documentation

## 2022-02-05 DIAGNOSIS — M542 Cervicalgia: Secondary | ICD-10-CM | POA: Diagnosis not present

## 2022-02-05 DIAGNOSIS — M5416 Radiculopathy, lumbar region: Secondary | ICD-10-CM | POA: Diagnosis not present

## 2022-02-05 DIAGNOSIS — M452 Ankylosing spondylitis of cervical region: Secondary | ICD-10-CM | POA: Diagnosis not present

## 2022-02-05 MED ORDER — METHOCARBAMOL 500 MG PO TABS: 500.0000 mg | ORAL_TABLET | Freq: Two times a day (BID) | ORAL | 0 refills | Status: DC

## 2022-02-05 MED ORDER — PREDNISONE 10 MG (21) PO TBPK: ORAL_TABLET | Freq: Every day | ORAL | 0 refills | Status: DC

## 2022-02-05 NOTE — ED Provider Triage Note (Signed)
Emergency Medicine Provider Triage Evaluation Note  Diana Jimenez , a 36 y.o. female  was evaluated in triage.  Pt complains of worsening cervical and lumbar pain.  Patient states she injured her neck and back when in prison in approximately 2009.  She has had pain since that time in the area and has not been evaluated as an outpatient other than plain films of cervical spine approximately 1 year ago.  She states that over the past month she has been taking care of her mother has had to lift her multiple times and has had a pleural to the floor.  She states she felt a pop in her low back and has had significant pain in the low back with bilateral radiculopathy since that time.  Also complains of midline cervical spine pain since moving her mother.  Review of Systems  Positive: As above Negative: As above  Physical Exam  BP 125/74   Pulse 67   Temp 98.1 F (36.7 C) (Oral)   Resp 14   Ht 5\' 5"  (1.651 m)   Wt 68 kg   SpO2 100%   BMI 24.95 kg/m  Gen:   Awake, no distress   Resp:  Normal effort  MSK:   Moves extremities without difficulty  Other:    Medical Decision Making  Medically screening exam initiated at 12:25 PM.  Appropriate orders placed.  Diana Jimenez was informed that the remainder of the evaluation will be completed by another provider, this initial triage assessment does not replace that evaluation, and the importance of remaining in the ED until their evaluation is complete.     Lennette Bihari, PA-C 02/05/22 1232

## 2022-02-05 NOTE — ED Provider Notes (Signed)
[facility] EMERGENCY DEPARTMENT Provider Note   CSN: [id number] Arrival date & time: 02/05/22  1114     History  Chief Complaint  Patient presents with   Back Pain    Valisha Faulconer is a 36 y.o. female.  36 year old female who presents with chronic lumbar and cervical pain for several years.  Gets worse after lifting objects.  Pain characterizes sharp.  No bowel or bladder dysfunction.  Better with rest.  Has been using over-the-counter medications without relief.  Denies any peripheral weakness.       Home Medications Prior to Admission medications   Medication Sig Start Date End Date Taking? Authorizing Provider  lidocaine (LIDODERM) 5 % Place 1 patch onto the skin daily. Remove & Discard patch within 12 hours or as directed by MD 05/19/20   Harlene Salts A, PA-C  Probiotic Product (PROBIOTIC DAILY) CAPS Take 1 capsule by mouth daily. 03/01/16   Wojeck, Hinton Dyer, NP  tiZANidine (ZANAFLEX) 4 MG tablet Take 1 tablet (4 mg total) by mouth every 6 (six) hours as needed for muscle spasms. 01/18/21   Tomi Bamberger, PA-C      Allergies    Haldol [haloperidol lactate] and Penicillins    Review of Systems   Review of Systems  All other systems reviewed and are negative.   Physical Exam Updated Vital Signs BP 125/74   Pulse 67   Temp 98.1 F (36.7 C) (Oral)   Resp 14   Ht 1.651 m (5\' 5" )   Wt 68 kg   SpO2 100%   BMI 24.95 kg/m  Physical Exam Vitals and nursing note reviewed.  Constitutional:      General: She is not in acute distress.    Appearance: Normal appearance. She is well-developed. She is not toxic-appearing.  HENT:     Head: Normocephalic and atraumatic.  Eyes:     General: Lids are normal.     Conjunctiva/sclera: Conjunctivae normal.     Pupils: Pupils are equal, round, and reactive to light.  Neck:     Thyroid: No thyroid mass.     Trachea: No tracheal deviation.  Cardiovascular:     Rate and Rhythm: Normal rate and  regular rhythm.     Heart sounds: Normal heart sounds. No murmur heard.    No gallop.  Pulmonary:     Effort: Pulmonary effort is normal. No respiratory distress.     Breath sounds: Normal breath sounds. No stridor. No decreased breath sounds, wheezing, rhonchi or rales.  Abdominal:     General: There is no distension.     Palpations: Abdomen is soft.     Tenderness: There is no abdominal tenderness. There is no rebound.  Musculoskeletal:        General: No tenderness. Normal range of motion.     Cervical back: Normal range of motion and neck supple.  Skin:    General: Skin is warm and dry.     Findings: No abrasion or rash.  Neurological:     Mental Status: She is alert and oriented to person, place, and time. Mental status is at baseline.     GCS: GCS eye subscore is 4. GCS verbal subscore is 5. GCS motor subscore is 6.     Cranial Nerves: Cranial nerves 2-12 are intact. No cranial nerve deficit.     Sensory: No sensory deficit.     Motor: Motor function is intact.     Gait: Gait is intact.  Psychiatric:  Attention and Perception: Attention normal.        Speech: Speech normal.        Behavior: Behavior normal.     ED Results / Procedures / Treatments   Labs (all labs ordered are listed, but only abnormal results are displayed) Labs Reviewed - No data to display  EKG None  Radiology CT Lumbar Spine Wo Contrast  Result Date: 02/05/2022 CLINICAL DATA:  Lumbar radiculopathy. EXAM: CT LUMBAR SPINE WITHOUT CONTRAST TECHNIQUE: Multidetector CT imaging of the lumbar spine was performed without intravenous contrast administration. Multiplanar CT image reconstructions were also generated. RADIATION DOSE REDUCTION: This exam was performed according to the departmental dose-optimization program which includes automated exposure control, adjustment of the mA and/or kV according to patient size and/or use of iterative reconstruction technique. COMPARISON:  None Available.  FINDINGS: Segmentation: Standard; the lowest formed disc space is designated L5-S1. Alignment: There is trace retrolisthesis of L4 on L5. Alignment is otherwise normal. Vertebrae: Vertebral body heights are preserved. There is no evidence of acute fracture. There is no suspicious osseous lesion. Paraspinal and other soft tissues: Unremarkable. Disc levels: T12-L1: Unremarkable. L1-L2: Unremarkable L2-L3: Unremarkable. L3-L4: Unremarkable L4-L5: There is mild disc space narrowing with a minimal bulge but no significant spinal canal or neural foraminal stenosis L5-S1: There is disc space narrowing with vacuum disc phenomenon and mild degenerative endplate change. There is an inferiorly migrated right subarticular zone extrusion extending approximately 1.0 cm below the level of the disc space which likely impinges the traversing right S1 nerve root (4-89, 9-45). No significant spinal canal or neural foraminal stenosis. 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 Electronically Signed   By: Valetta Mole M.D.   On: 02/05/2022 14:28   CT Cervical Spine Wo Contrast  Result Date: 02/05/2022 CLINICAL DATA:  Chronic neck pain EXAM: CT CERVICAL SPINE WITHOUT CONTRAST TECHNIQUE: Multidetector CT imaging of the cervical spine was performed without intravenous contrast. Multiplanar CT image reconstructions were also generated. RADIATION DOSE REDUCTION: This exam was performed according to the departmental dose-optimization program which includes automated exposure control, adjustment of the mA and/or kV according to patient size and/or use of iterative reconstruction technique. COMPARISON:  None Available. FINDINGS: Alignment: There is slight reversal of the normal cervical spine curvature. There is no antero or retrolisthesis. There is no evidence of traumatic malalignment. Skull base and vertebrae: Skull base alignment is maintained.  Vertebral body heights are preserved. There is no evidence of acute fracture. There is no suspicious osseous lesion. Soft tissues and spinal canal: No prevertebral fluid or swelling. No visible canal hematoma. Disc levels: There is mild disc space narrowing, degenerative endplate change, and facet arthropathy at C5-C6 but without significant spinal canal or neural foraminal stenosis. There is no other significant degenerative change. There is no significant spinal canal or neural foraminal stenosis at the other levels. There is no evidence of disc herniation. Upper chest: The lung apices are not included within the field of view. Other: None. IMPRESSION: Mild degenerative change at C5-C6 without significant spinal canal or neural foraminal stenosis. Otherwise normal cervical spine CT. Electronically Signed   By: Valetta Mole M.D.   On: 02/05/2022 14:23    Procedures Procedures    Medications Ordered in ED Medications - No data to display  ED Course/ Medical Decision Making/ A&P  Medical Decision Making  She had a CT of her cervical and lumbar spine was did not show any evidence of cord compression.  She is neurologic intact at this time.  Will treat with course of steroids as her lumbar spine did show some S1 nerve root impingement.  Patient comfortable with this plan        Final Clinical Impression(s) / ED Diagnoses Final diagnoses:  None    Rx / DC Orders ED Discharge Orders     None         Lacretia Leigh, MD 02/05/22 1549

## 2022-02-05 NOTE — ED Triage Notes (Signed)
Pt endorses upper back pain and neck pain. Reports an injury in 2009-2010 in jail that was never checked out. Pt has been taking care of her mother and reinjured her neck about 3 months ago.

## 2022-04-13 ENCOUNTER — Ambulatory Visit
Admission: EM | Admit: 2022-04-13 | Discharge: 2022-04-13 | Disposition: A | Discharge status: Home / Self Care | Payer: Medicaid Other | Attending: Physician Assistant | Admitting: Physician Assistant

## 2022-04-13 DIAGNOSIS — K029 Dental caries, unspecified: Secondary | ICD-10-CM

## 2022-04-13 DIAGNOSIS — K047 Periapical abscess without sinus: Secondary | ICD-10-CM

## 2022-04-13 MED ORDER — CLINDAMYCIN HCL 300 MG PO CAPS: 300.0000 mg | ORAL_CAPSULE | Freq: Three times a day (TID) | ORAL | 1 refills | Status: DC

## 2022-04-13 MED ORDER — IBUPROFEN 800 MG PO TABS: 800.0000 mg | ORAL_TABLET | Freq: Three times a day (TID) | ORAL | 1 refills | Status: DC

## 2022-04-13 NOTE — Discharge Instructions (Addendum)
Advised to take the clindamycin 300 mg 3 times a day to treat infection. Advised take ibuprofen 800 mg every 8 hours with food to help reduce pain, swelling discomfort.  Advised to review the dental resources to see if you can arrange an appointment with one of the offices to have the dental problems addressed.  Advised follow-up PCP or return to urgent care as needed.

## 2022-04-13 NOTE — ED Provider Notes (Signed)
[facility]    CSN: [id number] Arrival date & time: 04/13/22  1432      History   Chief Complaint Chief Complaint  Patient presents with   Abscess    HPI Diana Jimenez is a 37 y.o. female.   37 year old female presents with right lower tooth abscess.  Patient indicates for the past week she has been having right lower wisdom tooth pain, swelling and discomfort.  Patient indicates that the wisdom tooth is broken off and that she has been having swelling and pain from the area.  Patient also indicates that she has several lower front teeth that also have abscess formation.  Patient indicates she has been taking some ibuprofen 800 mg for pain relief.  She is without fever or chills.  Patient indicates that she has not been able to get in with a dentist due to financial constraints however she does now have Medicaid and she hopes to be able to arrange an appointment with a dentist within the next week.  She is without nausea or vomiting   Abscess   Past Medical History:  Diagnosis Date   Depressed     There are no problems to display for this patient.   Past Surgical History:  Procedure Laterality Date   ORIF FIBULA FRACTURE Left 10/25/2014   Procedure: OPEN REDUCTION INTERNAL FIXATION (ORIF) FIBULA FRACTURE;  Surgeon: Marybelle Killings, MD;  Location: [facility];  Service: Orthopedics;  Laterality: Left;    OB History   No obstetric history on file.      Home Medications    Prior to Admission medications   Medication Sig Start Date End Date Taking? Authorizing Provider  clindamycin (CLEOCIN) 300 MG capsule Take 1 capsule (300 mg total) by mouth 3 (three) times daily. 04/13/22  Yes Nyoka Lint, PA-C  ibuprofen (ADVIL) 800 MG tablet Take 1 tablet (800 mg total) by mouth 3 (three) times daily. 04/13/22  Yes Nyoka Lint, PA-C  tiZANidine (ZANAFLEX) 4 MG tablet Take 1 tablet (4 mg total) by mouth every 6 (six) hours as needed for muscle spasms. 01/18/21  Yes Francene Finders, PA-C  lidocaine (LIDODERM) 5 % Place 1 patch onto the skin daily. Remove & Discard patch within 12 hours or as directed by MD 05/19/20   Deliah Boston, PA-C  methocarbamol (ROBAXIN) 500 MG tablet Take 1 tablet (500 mg total) by mouth 2 (two) times daily. 02/05/22   Lacretia Leigh, MD  predniSONE (STERAPRED UNI-PAK 21 TAB) 10 MG (21) TBPK tablet Take by mouth daily. Take 6 tabs by mouth daily  for 2 days, then 5 tabs for 2 days, then 4 tabs for 2 days, then 3 tabs for 2 days, 2 tabs for 2 days, then 1 tab by mouth daily for 2 days 02/05/22   Lacretia Leigh, MD  Probiotic Product (PROBIOTIC DAILY) CAPS Take 1 capsule by mouth daily. 03/01/16   Wojeck, Bernadene Bell, NP    Family History History reviewed. No pertinent family history.  Social History Social History   Tobacco Use   Smoking status: Some Days    Packs/day: 1.50    Years: 14.00    Total pack years: 21.00    Types: Cigarettes   Smokeless tobacco: Never  Substance Use Topics   Alcohol use: Yes    Comment: 6 a week   Drug use: No     Allergies   Haldol [haloperidol lactate] and Penicillins   Review of Systems Review of Systems  HENT:  Positive for dental problem (lower left wisdom tooth pain and abscess).      Physical Exam Triage Vital Signs ED Triage Vitals  Enc Vitals Group     BP 04/13/22 1503 (!) 144/94     Pulse Rate 04/13/22 1503 78     Resp 04/13/22 1503 12     Temp 04/13/22 1503 98 F (36.7 C)     Temp Source 04/13/22 1503 Oral     SpO2 04/13/22 1503 98 %     Weight --      Height --      Head Circumference --      Peak Flow --      Pain Score 04/13/22 1500 8     Pain Loc --      Pain Edu? --      Excl. in [place]? --    No data found.  Updated Vital Signs BP (!) 144/94 (BP Location: Right Arm)   Pulse 78   Temp 98 F (36.7 C) (Oral)   Resp 12   LMP 04/12/2022   SpO2 98%   Visual Acuity Right Eye Distance:   Left Eye Distance:   Bilateral Distance:    Right Eye Near:   Left  Eye Near:    Bilateral Near:     Physical Exam Constitutional:      Appearance: Normal appearance.  HENT:     Mouth/Throat:     Mouth: Mucous membranes are moist.     Pharynx: Oropharynx is clear.      Comments: Mouth: Right lower wisdom tooth broken with mild gum swelling and abscess formation without active drainage. Neurological:     Mental Status: She is alert.      UC Treatments / Results  Labs (all labs ordered are listed, but only abnormal results are displayed) Labs Reviewed - No data to display  EKG   Radiology No results found.  Procedures Procedures (including critical care time)  Medications Ordered in UC Medications - No data to display  Initial Impression / Assessment and Plan / UC Course  I have reviewed the triage vital signs and the nursing notes.  Pertinent labs & imaging results that were available during my care of the patient were reviewed by me and considered in my medical decision making (see chart for details).    Plan: The diagnosis will be treated with the following: 1.  Dental abscess: A.  Clindamycin 300 mg every 8 hours with food to treat infection. B.  Ibuprofen 800 mg every 8 hours with food to reduce pain. 2.  Dental pain: A.  Ibuprofen 800 mg every 8 hours with food to help reduce pain. 3.  Patient advised to use the dental resource information to contact the dentist and arrange an appointment for evaluation. 4.  Advised to return to urgent care as needed. Final Clinical Impressions(s) / UC Diagnoses   Final diagnoses:  Pain due to dental caries  Dental abscess     Discharge Instructions      Advised to take the clindamycin 300 mg 3 times a day to treat infection. Advised take ibuprofen 800 mg every 8 hours with food to help reduce pain, swelling discomfort.  Advised to review the dental resources to see if you can arrange an appointment with one of the offices to have the dental problems addressed.  Advised follow-up  PCP or return to urgent care as needed.    ED Prescriptions     Medication Sig Dispense Auth. Provider  ibuprofen (ADVIL) 800 MG tablet Take 1 tablet (800 mg total) by mouth 3 (three) times daily. 30 tablet Nyoka Lint, PA-C   clindamycin (CLEOCIN) 300 MG capsule Take 1 capsule (300 mg total) by mouth 3 (three) times daily. 21 capsule Nyoka Lint, PA-C      PDMP not reviewed this encounter.   Nyoka Lint, PA-C 04/13/22 1517

## 2022-04-13 NOTE — ED Triage Notes (Signed)
Pt is here for abscess in mouth x 1wk

## 2022-12-25 ENCOUNTER — Ambulatory Visit
Admission: EM | Admit: 2022-12-25 | Discharge: 2022-12-25 | Disposition: A | Discharge status: Home / Self Care | Payer: Medicaid Other

## 2022-12-25 DIAGNOSIS — J069 Acute upper respiratory infection, unspecified: Secondary | ICD-10-CM | POA: Diagnosis not present

## 2022-12-25 NOTE — ED Triage Notes (Signed)
Patient states "I just need a doctors note to be able to return to work, I missed 2 days because I had a headache and cough. Took at home Covid test, negative. Treated with Dayquil/ Nyquil gel and Mucinex with some relief.

## 2023-01-16 NOTE — ED Provider Notes (Signed)
[facility]    CSN: [id number] Arrival date & time: 12/25/22  1534      History   Chief Complaint No chief complaint on file.   HPI Diana Jimenez is a 37 y.o. female.   Patient here today for doctors note to return to work. She notes that she has had headache and cough but symptoms are improving and covid test was negative. She has taken dayquil/ nyquil and mucinex.     Past Medical History:  Diagnosis Date   Depressed     There are no problems to display for this patient.   Past Surgical History:  Procedure Laterality Date   ORIF FIBULA FRACTURE Left 10/25/2014   Procedure: OPEN REDUCTION INTERNAL FIXATION (ORIF) FIBULA FRACTURE;  Surgeon: Eldred Manges, MD;  Location: [facility] OR;  Service: Orthopedics;  Laterality: Left;    OB History   No obstetric history on file.      Home Medications    Prior to Admission medications   Medication Sig Start Date End Date Taking? Authorizing Provider  clindamycin (CLEOCIN) 300 MG capsule Take 1 capsule (300 mg total) by mouth 3 (three) times daily. 04/13/22   Ellsworth Lennox, PA-C  ibuprofen (ADVIL) 800 MG tablet Take 1 tablet (800 mg total) by mouth 3 (three) times daily. 04/13/22   Ellsworth Lennox, PA-C  lidocaine (LIDODERM) 5 % Place 1 patch onto the skin daily. Remove & Discard patch within 12 hours or as directed by MD 05/19/20   Bill Salinas, PA-C  methocarbamol (ROBAXIN) 500 MG tablet Take 1 tablet (500 mg total) by mouth 2 (two) times daily. 02/05/22   Lorre Nick, MD  predniSONE (STERAPRED UNI-PAK 21 TAB) 10 MG (21) TBPK tablet Take by mouth daily. Take 6 tabs by mouth daily  for 2 days, then 5 tabs for 2 days, then 4 tabs for 2 days, then 3 tabs for 2 days, 2 tabs for 2 days, then 1 tab by mouth daily for 2 days 02/05/22   Lorre Nick, MD  Probiotic Product (PROBIOTIC DAILY) CAPS Take 1 capsule by mouth daily. 03/01/16   Wojeck, Hinton Dyer, NP  tiZANidine (ZANAFLEX) 4 MG tablet Take 1 tablet (4 mg total) by  mouth every 6 (six) hours as needed for muscle spasms. 01/18/21   Tomi Bamberger, PA-C    Family History No family history on file.  Social History Social History   Tobacco Use   Smoking status: Every Day    Current packs/day: 1.50    Average packs/day: 1.5 packs/day for 14.0 years (21.0 ttl pk-yrs)    Types: Cigarettes   Smokeless tobacco: Never  Substance Use Topics   Alcohol use: Yes    Comment: 6 a week   Drug use: No     Allergies   Haldol [haloperidol lactate] and Penicillins   Review of Systems Review of Systems  Constitutional:  Negative for chills and fever.  HENT:  Positive for congestion. Negative for ear pain and sore throat.   Eyes:  Negative for discharge and redness.  Respiratory:  Positive for cough. Negative for shortness of breath and wheezing.   Gastrointestinal:  Negative for abdominal pain, diarrhea, nausea and vomiting.  Neurological:  Positive for headaches.     Physical Exam Triage Vital Signs ED Triage Vitals  Encounter Vitals Group     BP 12/25/22 1650 118/81     Systolic BP Percentile --      Diastolic BP Percentile --  Pulse Rate 12/25/22 1650 61     Resp 12/25/22 1650 17     Temp 12/25/22 1650 98.6 F (37 C)     Temp Source 12/25/22 1650 Oral     SpO2 12/25/22 1650 100 %     Weight 12/25/22 1648 150 lb (68 kg)     Height 12/25/22 1648 5\' 6"  (1.676 m)     Head Circumference --      Peak Flow --      Pain Score 12/25/22 1648 0     Pain Loc --      Pain Education --      Exclude from Growth Chart --    No data found.  Updated Vital Signs BP 118/81 (BP Location: Left Arm)   Pulse 61   Temp 98.6 F (37 C) (Oral)   Resp 17   Ht 5\' 6"  (1.676 m)   Wt 150 lb (68 kg)   LMP 12/04/2022   SpO2 100%   BMI 24.21 kg/m   Visual Acuity Right Eye Distance:   Left Eye Distance:   Bilateral Distance:    Right Eye Near:   Left Eye Near:    Bilateral Near:     Physical Exam Vitals and nursing note reviewed.   Constitutional:      General: She is not in acute distress.    Appearance: Normal appearance. She is not ill-appearing.  HENT:     Head: Normocephalic and atraumatic.     Nose: Congestion present.     Mouth/Throat:     Mouth: Mucous membranes are moist.     Pharynx: No oropharyngeal exudate or posterior oropharyngeal erythema.  Eyes:     Conjunctiva/sclera: Conjunctivae normal.  Cardiovascular:     Rate and Rhythm: Normal rate and regular rhythm.     Heart sounds: Normal heart sounds. No murmur heard. Pulmonary:     Effort: Pulmonary effort is normal. No respiratory distress.     Breath sounds: Normal breath sounds. No wheezing, rhonchi or rales.  Skin:    General: Skin is warm and dry.  Neurological:     Mental Status: She is alert.  Psychiatric:        Mood and Affect: Mood normal.        Thought Content: Thought content normal.      UC Treatments / Results  Labs (all labs ordered are listed, but only abnormal results are displayed) Labs Reviewed - No data to display  EKG   Radiology No results found.  Procedures Procedures (including critical care time)  Medications Ordered in UC Medications - No data to display  Initial Impression / Assessment and Plan / UC Course  I have reviewed the triage vital signs and the nursing notes.  Pertinent labs & imaging results that were available during my care of the patient were reviewed by me and considered in my medical decision making (see chart for details).     Suspect viral etiology of symptoms. Advised continued symptomatic treatment and follow up with any further concerns.   Final Clinical Impressions(s) / UC Diagnoses   Final diagnoses:  Acute upper respiratory infection   Discharge Instructions   None    ED Prescriptions   None    PDMP not reviewed this encounter.   Tomi Bamberger, PA-C 01/16/23 2228

## 2023-12-16 ENCOUNTER — Emergency Department (HOSPITAL_COMMUNITY)
Admission: EM | Admit: 2023-12-16 | Discharge: 2023-12-16 | Discharge status: Against Medical Advice | Payer: Self-pay | Attending: Emergency Medicine | Admitting: Emergency Medicine

## 2023-12-16 DIAGNOSIS — Z5321 Procedure and treatment not carried out due to patient leaving prior to being seen by health care provider: Secondary | ICD-10-CM | POA: Insufficient documentation

## 2023-12-16 DIAGNOSIS — M549 Dorsalgia, unspecified: Secondary | ICD-10-CM | POA: Insufficient documentation

## 2023-12-16 NOTE — ED Triage Notes (Signed)
 PT presents for back pain from Cervical to lumbar with hx of sciatic pain.  5/10 with ibuprofen  on board.

## 2023-12-21 ENCOUNTER — Observation Stay (HOSPITAL_COMMUNITY)
Admission: EM | Admit: 2023-12-21 | Discharge: 2023-12-22 | Disposition: A | Discharge status: Home / Self Care | Payer: Self-pay | Attending: Internal Medicine | Admitting: Internal Medicine

## 2023-12-21 ENCOUNTER — Emergency Department (HOSPITAL_COMMUNITY): Payer: Self-pay

## 2023-12-21 ENCOUNTER — Encounter (HOSPITAL_COMMUNITY): Payer: Self-pay | Admitting: Emergency Medicine

## 2023-12-21 DIAGNOSIS — F10920 Alcohol use, unspecified with intoxication, uncomplicated: Secondary | ICD-10-CM | POA: Diagnosis not present

## 2023-12-21 DIAGNOSIS — R079 Chest pain, unspecified: Secondary | ICD-10-CM | POA: Diagnosis present

## 2023-12-21 DIAGNOSIS — Z87891 Personal history of nicotine dependence: Secondary | ICD-10-CM | POA: Diagnosis not present

## 2023-12-21 DIAGNOSIS — R7989 Other specified abnormal findings of blood chemistry: Secondary | ICD-10-CM | POA: Diagnosis not present

## 2023-12-21 LAB — BASIC METABOLIC PANEL WITH GFR
Anion gap: 14 (ref 5–15)
BUN: 7 mg/dL (ref 6–20)
CO2: 20 mmol/L — ABNORMAL LOW (ref 22–32)
Calcium: 9.5 mg/dL (ref 8.9–10.3)
Chloride: 102 mmol/L (ref 98–111)
Creatinine, Ser: 1.13 mg/dL — ABNORMAL HIGH (ref 0.44–1.00)
GFR, Estimated: >60 mL/min (ref >60)
Glucose, Bld: 96 mg/dL (ref 70–99)
Potassium: 3.9 mmol/L (ref 3.5–5.1)
Sodium: 136 mmol/L (ref 135–145)

## 2023-12-21 LAB — CBC
HCT: 44.8 % (ref 36.0–46.0)
Hemoglobin: 15.4 g/dL — ABNORMAL HIGH (ref 12.0–15.0)
MCH: 32.4 pg (ref 26.0–34.0)
MCHC: 34.4 g/dL (ref 30.0–36.0)
MCV: 94.3 fL (ref 80.0–100.0)
Platelets: 249 K/uL (ref 150–400)
RBC: 4.75 MIL/uL (ref 3.87–5.11)
RDW: 11.8 % (ref 11.5–15.5)
WBC: 13.9 K/uL — ABNORMAL HIGH (ref 4.0–10.5)
nRBC: 0 % (ref 0.0–0.2)

## 2023-12-21 LAB — HCG, SERUM, QUALITATIVE: Preg, Serum: NEGATIVE

## 2023-12-21 LAB — TROPONIN I (HIGH SENSITIVITY): Troponin I (High Sensitivity): 27 ng/L — ABNORMAL HIGH (ref <18)

## 2023-12-21 NOTE — ED Triage Notes (Signed)
 Pt here from home with c/o chest pain after a walk this evening , pt also c/o low back pain ,which is chronic

## 2023-12-22 ENCOUNTER — Other Ambulatory Visit: Payer: Self-pay

## 2023-12-22 ENCOUNTER — Emergency Department (HOSPITAL_COMMUNITY): Payer: Self-pay

## 2023-12-22 DIAGNOSIS — R079 Chest pain, unspecified: Secondary | ICD-10-CM

## 2023-12-22 LAB — TROPONIN I (HIGH SENSITIVITY)
Troponin I (High Sensitivity): 37 ng/L — ABNORMAL HIGH (ref <18)
Troponin I (High Sensitivity): 25 ng/L — ABNORMAL HIGH (ref <18)
Troponin I (High Sensitivity): 23 ng/L — ABNORMAL HIGH (ref <18)

## 2023-12-22 MED ORDER — IOHEXOL 350 MG/ML SOLN
75.0000 mL | Freq: Once | INTRAVENOUS | Status: AC | PRN
  Administered 2023-12-22: 75 mL via INTRAVENOUS

## 2023-12-22 MED ORDER — PANTOPRAZOLE SODIUM 40 MG PO TBEC: 40.0000 mg | DELAYED_RELEASE_TABLET | Freq: Every day | ORAL | 0 refills | Status: AC

## 2023-12-22 MED ORDER — POLYETHYLENE GLYCOL 3350 17 G PO PACK: 17.0000 g | PACK | Freq: Every day | ORAL | Status: DC | PRN

## 2023-12-22 MED ORDER — ACETAMINOPHEN 325 MG PO TABS
650.0000 mg | ORAL_TABLET | Freq: Four times a day (QID) | ORAL | Status: DC | PRN
  Administered 2023-12-22: 650 mg via ORAL
  Filled 2023-12-22: qty 2

## 2023-12-22 MED ORDER — BISACODYL 5 MG PO TBEC: 5.0000 mg | DELAYED_RELEASE_TABLET | Freq: Every day | ORAL | Status: DC | PRN

## 2023-12-22 MED ORDER — ALBUTEROL SULFATE (2.5 MG/3ML) 0.083% IN NEBU: 2.5000 mg | INHALATION_SOLUTION | Freq: Four times a day (QID) | RESPIRATORY_TRACT | Status: DC | PRN

## 2023-12-22 MED ORDER — ACETAMINOPHEN 325 MG PO TABS: 650.0000 mg | ORAL_TABLET | Freq: Four times a day (QID) | ORAL | Status: AC | PRN

## 2023-12-22 MED ORDER — HYDRALAZINE HCL 20 MG/ML IJ SOLN: 10.0000 mg | Freq: Four times a day (QID) | INTRAMUSCULAR | Status: DC | PRN

## 2023-12-22 MED ORDER — ACETAMINOPHEN 650 MG RE SUPP: 650.0000 mg | Freq: Four times a day (QID) | RECTAL | Status: DC | PRN

## 2023-12-22 MED ORDER — ENOXAPARIN SODIUM 40 MG/0.4ML IJ SOSY
40.0000 mg | PREFILLED_SYRINGE | Freq: Every day | INTRAMUSCULAR | Status: DC
  Administered 2023-12-22: 40 mg via SUBCUTANEOUS
  Filled 2023-12-22: qty 0.4

## 2023-12-22 MED ORDER — LIDOCAINE 5 % EX PTCH
1.0000 | MEDICATED_PATCH | Freq: Every day | CUTANEOUS | Status: DC
  Administered 2023-12-22: 1 via TRANSDERMAL
  Filled 2023-12-22: qty 1

## 2023-12-22 NOTE — ED Notes (Signed)
 Pt ambulated to the bathroom alongside NT, pt tolerated well.

## 2023-12-22 NOTE — Progress Notes (Signed)
 Progress Note  Patient Name: Diana Jimenez Date of Encounter: 12/22/2023  Primary Cardiologist: None   Subjective   No chest pain or sob.   Inpatient Medications    Scheduled Meds:  enoxaparin (LOVENOX) injection  40 mg Subcutaneous Daily   lidocaine   1 patch Transdermal Daily   Continuous Infusions:  PRN Meds: acetaminophen  **OR** acetaminophen , albuterol, bisacodyl, hydrALAZINE, polyethylene glycol   Vital Signs    Vitals:   12/22/23 0228 12/22/23 0245 12/22/23 0546 12/22/23 0930  BP:  (!) 139/101 126/85 110/69  Pulse:  88 83 62  Resp:  15 18 (!) 23  Temp: 97.6 F (36.4 C)  97.8 F (36.6 C)   TempSrc: Oral  Oral   SpO2:  100% 100% 100%  Weight:      Height:       No intake or output data in the 24 hours ending 12/22/23 0943 Filed Weights   12/22/23 0225  Weight: 74.8 kg    Telemetry    nsr - Personally Reviewed  ECG    Nsr  - Personally Reviewed  Physical Exam   GEN: No acute distress.   Neck: No JVD Cardiac: RRR, no murmurs, rubs, or gallops.  Respiratory: Clear to auscultation bilaterally. GI: Soft, nontender, non-distended  MS: No edema; No deformity. Neuro:  Nonfocal  Psych: Normal affect   Labs    Chemistry Recent Labs  Lab 12/21/23 2201  NA 136  K 3.9  CL 102  CO2 20*  GLUCOSE 96  BUN 7  CREATININE 1.13*  CALCIUM 9.5  GFRNONAA >60  ANIONGAP 14     Hematology Recent Labs  Lab 12/21/23 2201  WBC 13.9*  RBC 4.75  HGB 15.4*  HCT 44.8  MCV 94.3  MCH 32.4  MCHC 34.4  RDW 11.8  PLT 249    Cardiac EnzymesNo results for input(s): TROPONINI in the last 168 hours. No results for input(s): TROPIPOC in the last 168 hours.   BNPNo results for input(s): BNP, PROBNP in the last 168 hours.   DDimer No results for input(s): DDIMER in the last 168 hours.   Radiology    CT Angio Chest PE W and/or Wo Contrast Result Date: 12/22/2023 EXAM: CTA CHEST 12/22/2023 04:07:20 AM TECHNIQUE: CTA of the chest was  performed after the administration of intravenous contrast. Multiplanar reformatted images are provided for review. MIP images are provided for review. Automated exposure control, iterative reconstruction, and/or weight based adjustment of the mA/kV was utilized to reduce the radiation dose to as low as reasonably achievable. COMPARISON: None available. CLINICAL HISTORY: Pulmonary embolism (PE) suspected, low to intermediate prob, neg D-dimer. FINDINGS: PULMONARY ARTERIES: Pulmonary arteries are adequately opacified for evaluation. No acute pulmonary embolus. Main pulmonary artery is normal in caliber. MEDIASTINUM: The heart and pericardium demonstrate no acute abnormality. There is no acute abnormality of the thoracic aorta. LYMPH NODES: No mediastinal, hilar or axillary lymphadenopathy. LUNGS AND PLEURA: The lungs are without acute process. No focal consolidation or pulmonary edema. No evidence of pleural effusion or pneumothorax. UPPER ABDOMEN: Limited images of the upper abdomen are unremarkable. SOFT TISSUES AND BONES: No acute bone or soft tissue abnormality. IMPRESSION: 1. No pulmonary embolism or acute pulmonary abnormality. Electronically signed by: Evalene Coho MD 12/22/2023 04:52 AM EDT RP Workstation: [id number]   DG Chest 2 View Result Date: 12/21/2023 EXAM: 2 VIEW(S) XRAY OF THE CHEST 12/21/2023 10:09:00 PM COMPARISON: 6 / 1 / 20:19 CLINICAL HISTORY: cp. Cp weakness FINDINGS: LUNGS AND PLEURA: No focal pulmonary  opacity. No pulmonary edema. No pleural effusion. No pneumothorax. HEART AND MEDIASTINUM: No acute abnormality of the cardiac and mediastinal silhouettes. BONES AND SOFT TISSUES: No acute osseous abnormality. IMPRESSION: 1. Normal chest radiograph. Electronically signed by: Lizardi Gatlin MD 12/21/2023 10:25 PM EDT RP Workstation: [id number]    Cardiac Studies   Troponin negative  Patient Profile     38 y.o. female admitted with chest pain after developing migraine symptoms and  taking 800 mg of ibuprofen .   Assessment & Plan    Non-cardiac chest pain - no additional workup is indicated. Her pain developed 30 minutes after taking ibuprofen  and I strongly suspect inflammation of the esophagus. Troponins of 27, 37, 25, and 23 are not significant. We will sign off. She does not need additional ischemic workup. Tobacco abuse - I encouraged her to stop smoking.   [facility] will sign off.   The patient is ready for discharge today from a cardiac standpoint. Medication Recommendations:  avoid NSAIDs Other recommendations (labs, testing, etc):  none Follow up as an outpatient:  prn.  For questions or updates, please contact [facility] Please consult [email or web address] for contact info under Cardiology/STEMI.      Signed, Danelle Birmingham, MD  12/22/2023, 9:43 AM

## 2023-12-22 NOTE — Consult Note (Addendum)
 Cardiology Consultation   Patient ID: Diana Jimenez MRN: [id number]; DOB: Jul 29, 1985  Admit date: 12/21/2023 Date of Consult: 12/22/2023  PCP:  Patient, No Pcp Per   [facility] Providers Cardiologist:  None        Patient Profile: Diana Jimenez is a 38 y.o. female with a hx of tobacco use who is being seen 12/22/2023 for the evaluation of chest pain.     History of Present Illness: Diana Jimenez reports that yesterday she initially started to have headache, for which she took 4 tablets of Ibuprofen  (unclear dose) and then went on a walk. Towards the end of her walk, she noticed some epigastric discomfort, which she attributes to Ibuprofen . This spontaneously resolved after the walk. Shortly after that at around 8PM, she started to have focal left lower chest pain that is sharp, non-exertional, non-radiating, and non-positional. This pain is exacerbated by deep breaths. No associated shortness of breath, nausea, vomiting, palpitations, pre-syncope, or syncope. The pain has been persistent since 8PM. No recent illness or fevers.   In the ED, she was initially tachycardiac to 120 but this resolved. BP 130s-150s/100s-110s. Labs showed Cr 1.13, WBC 13.9, troponin 27 =>37. EKG with normal sinus rhythm and no acute ischemic changes. CTPE with no PE or acute findings.   Mother had a heart attack in her early 71s.  Past Medical History:  Diagnosis Date   Depressed     Past Surgical History:  Procedure Laterality Date   ORIF FIBULA FRACTURE Left 10/25/2014   Procedure: OPEN REDUCTION INTERNAL FIXATION (ORIF) FIBULA FRACTURE;  Surgeon: Oneil JAYSON Herald, MD;  Location: [facility] OR;  Service: Orthopedics;  Laterality: Left;     Home Medications:  Prior to Admission medications   Medication Sig Start Date End Date Taking? Authorizing Provider  clindamycin  (CLEOCIN ) 300 MG capsule Take 1 capsule (300 mg total) by mouth 3 (three) times daily. 04/13/22   Lynwood Lenis, PA-C  ibuprofen   (ADVIL ) 800 MG tablet Take 1 tablet (800 mg total) by mouth 3 (three) times daily. 04/13/22   Lynwood Lenis, PA-C  lidocaine  (LIDODERM ) 5 % Place 1 patch onto the skin daily. Remove & Discard patch within 12 hours or as directed by MD 05/19/20   Donah Riis A, PA-C  methocarbamol  (ROBAXIN ) 500 MG tablet Take 1 tablet (500 mg total) by mouth 2 (two) times daily. 02/05/22   Dasie Faden, MD  predniSONE  (STERAPRED UNI-PAK 21 TAB) 10 MG (21) TBPK tablet Take by mouth daily. Take 6 tabs by mouth daily  for 2 days, then 5 tabs for 2 days, then 4 tabs for 2 days, then 3 tabs for 2 days, 2 tabs for 2 days, then 1 tab by mouth daily for 2 days 02/05/22   Dasie Faden, MD  Probiotic Product (PROBIOTIC DAILY) CAPS Take 1 capsule by mouth daily. 03/01/16   Wojeck, Robyn K, NP  tiZANidine  (ZANAFLEX ) 4 MG tablet Take 1 tablet (4 mg total) by mouth every 6 (six) hours as needed for muscle spasms. 01/18/21   Billy Asberry FALCON, PA-C    Scheduled Meds:  Continuous Infusions:  PRN Meds:   Allergies:    Allergies  Allergen Reactions   Haldol [Haloperidol Lactate]     States mini seizures   Penicillins Itching    States the worst yeast infections ever.    Social History:   Social History   Socioeconomic History   Marital status: Single    Spouse name: Not on file   Number of children:  Not on file   Years of education: Not on file   Highest education level: Not on file  Occupational History   Not on file  Tobacco Use   Smoking status: Every Day    Current packs/day: 1.50    Average packs/day: 1.5 packs/day for 14.0 years (21.0 ttl pk-yrs)    Types: Cigarettes   Smokeless tobacco: Never  Substance and Sexual Activity   Alcohol use: Yes    Comment: 6 a week   Drug use: No   Sexual activity: Yes  Other Topics Concern   Not on file  Social History Narrative   Not on file   Social Drivers of Health   Financial Resource Strain: Not on file  Food Insecurity: Not on file   Transportation Needs: Not on file  Physical Activity: Not on file  Stress: Not on file  Social Connections: Not on file  Intimate Partner Violence: Not on file    Family History:   History reviewed. No pertinent family history.   ROS:  Please see the history of present illness.   All other ROS reviewed and negative.     Physical Exam/Data: Vitals:   12/22/23 0225 12/22/23 0225 12/22/23 0228 12/22/23 0245  BP: (!) 149/109   (!) 139/101  Pulse: 85   88  Resp: 15   15  Temp:   97.6 F (36.4 C)   TempSrc:   Oral   SpO2: 100%   100%  Weight:  74.8 kg    Height:  5' 5 (1.651 m)     No intake or output data in the 24 hours ending 12/22/23 0539    12/22/2023    2:25 AM 12/25/2022    4:48 PM 02/05/2022   11:48 AM  Last 3 Weights  Weight (lbs) 165 lb 150 lb 149 lb 14.6 oz  Weight (kg) 74.844 kg 68.04 kg 68 kg     Body mass index is 27.46 kg/m.  General:  Well nourished, well developed, in no acute distress HEENT: normal Neck: no JVD Vascular:  Distal pulses 2+ bilaterally Cardiac:  normal S1, S2; RRR; no murmur, focal tenderness over the left lower part of the chest Lungs:  clear to auscultation bilaterally, no wheezing, rhonchi or rales  Abd: soft, nontender Ext: no edema Musculoskeletal:  No deformities, BUE and BLE strength normal and equal Skin: warm and dry  Neuro:  CNs 2-12 intact, no focal abnormalities noted Psych:  Normal affect   EKG:  The EKG was personally reviewed and demonstrates:  normal sinus rhythm with no acute ischemic changes.  Relevant CV Studies: None  Laboratory Data: High Sensitivity Troponin:   Recent Labs  Lab 12/21/23 2201 12/22/23 0039  TROPONINIHS 27* 37*     Chemistry Recent Labs  Lab 12/21/23 2201  NA 136  K 3.9  CL 102  CO2 20*  GLUCOSE 96  BUN 7  CREATININE 1.13*  CALCIUM 9.5  GFRNONAA >60  ANIONGAP 14    No results for input(s): PROT, ALBUMIN, AST, ALT, ALKPHOS, BILITOT in the last 168 hours. Lipids  No results for input(s): CHOL, TRIG, HDL, LABVLDL, LDLCALC, CHOLHDL in the last 168 hours.  Hematology Recent Labs  Lab 12/21/23 2201  WBC 13.9*  RBC 4.75  HGB 15.4*  HCT 44.8  MCV 94.3  MCH 32.4  MCHC 34.4  RDW 11.8  PLT 249   Thyroid No results for input(s): TSH, FREET4 in the last 168 hours.  BNPNo results for input(s): BNP, PROBNP in  the last 168 hours.  DDimer No results for input(s): DDIMER in the last 168 hours.  Radiology/Studies:  CT Angio Chest PE W and/or Wo Contrast Result Date: 12/22/2023 EXAM: CTA CHEST 12/22/2023 04:07:20 AM TECHNIQUE: CTA of the chest was performed after the administration of intravenous contrast. Multiplanar reformatted images are provided for review. MIP images are provided for review. Automated exposure control, iterative reconstruction, and/or weight based adjustment of the mA/kV was utilized to reduce the radiation dose to as low as reasonably achievable. COMPARISON: None available. CLINICAL HISTORY: Pulmonary embolism (PE) suspected, low to intermediate prob, neg D-dimer. FINDINGS: PULMONARY ARTERIES: Pulmonary arteries are adequately opacified for evaluation. No acute pulmonary embolus. Main pulmonary artery is normal in caliber. MEDIASTINUM: The heart and pericardium demonstrate no acute abnormality. There is no acute abnormality of the thoracic aorta. LYMPH NODES: No mediastinal, hilar or axillary lymphadenopathy. LUNGS AND PLEURA: The lungs are without acute process. No focal consolidation or pulmonary edema. No evidence of pleural effusion or pneumothorax. UPPER ABDOMEN: Limited images of the upper abdomen are unremarkable. SOFT TISSUES AND BONES: No acute bone or soft tissue abnormality. IMPRESSION: 1. No pulmonary embolism or acute pulmonary abnormality. Electronically signed by: Evalene Coho MD 12/22/2023 04:52 AM EDT RP Workstation: [id number]   DG Chest 2 View Result Date: 12/21/2023 EXAM: 2 VIEW(S) XRAY OF THE CHEST  12/21/2023 10:09:00 PM COMPARISON: 6 / 1 / 20:19 CLINICAL HISTORY: cp. Cp weakness FINDINGS: LUNGS AND PLEURA: No focal pulmonary opacity. No pulmonary edema. No pleural effusion. No pneumothorax. HEART AND MEDIASTINUM: No acute abnormality of the cardiac and mediastinal silhouettes. BONES AND SOFT TISSUES: No acute osseous abnormality. IMPRESSION: 1. Normal chest radiograph. Electronically signed by: Hindley Gatlin MD 12/21/2023 10:25 PM EDT RP Workstation: [id number]     Assessment and Plan: Chest pain Non-exertional, non-positional, pleuritic, focal chest pain in the left lower chest with examination showing tenderness over the area of pain. EKG with no acute ischemic changes. She has evidence of mild myocardial injury with troponin of 27 =>37. CTPE with no evidence of PE. Overall, her clinical presentation is mostly consistent with non-cardiac chest pain (most likely musculoskeletal chest pain). Given the mild myocardial injury, would obtain TTE. - TTE as above - Lidocaine  patch over chest  Risk Assessment/Risk Scores:              For questions or updates, please contact [facility] Please consult [email or web address] for contact info under      Signed, Gillian CHRISTELLA Cass, MD  12/22/2023 5:39 AM

## 2023-12-22 NOTE — Consult Note (Addendum)
 [facility] Consult note  Charlesetta Milliron FMW:[id number] DOB: Nov 16, 1985 DOA: 12/21/2023 PCP: Patient, No Pcp Per  Presented from: Home Chief Complaint: Chest pain  History of Present Illness: Diana Jimenez is a 38 y.o. female with PMH significant for chronic back pain, sciatica, family history positive for cardiac disease, mother had heart attack in her early 45s.  Patient presented to ED last night from home with complaint of chest pain.   Yesterday afternoon, she had an episode of headache for which she took 4 tablets of ibuprofen  (unclear dose) and went for a walk.  Towards the end of the walk, she felt some epigastric discomfort.  It resolved after she returned home.  Shortly after 8 PM, she had episode of left lower chest pain which she described as sharp, nonexertional, nonradiating, nonpositional, exacerbated with deep breaths.  In the ED, patient was afebrile, initially tachycardic to 120s, blood pressure 139/122, breathing on room air In few hours, tachycardia resolved, blood pressure improved. Labs with WBC count 13.9, hemoglobin 15.4 Troponin 27> 37> 25 Creatinine 1.13 CT angio chest did not show any acute pulmonary findings.  Seen by cardiology.   Recommended trend troponin and obtain TTE Hospitalist service was consulted for further management Stable overnight in the ED, heart rate in 80s, blood pressure down to 120s this morning, breathing on room air  At the time of my evaluation, patient was lying on bed.  Not in distress. Reports chronic back pain after an accident long time ago.  Also reports she did stressful physical job but currently not working. Takes ibuprofen  PRN, not on GI prophylaxis.  Never had GI symptoms or required endoscopy.   Review of Systems:  All systems were reviewed and were negative unless otherwise mentioned in the HPI   Past medical history: Past Medical History:  Diagnosis Date   Depressed     Past surgical history: Past  Surgical History:  Procedure Laterality Date   ORIF FIBULA FRACTURE Left 10/25/2014   Procedure: OPEN REDUCTION INTERNAL FIXATION (ORIF) FIBULA FRACTURE;  Surgeon: Oneil JAYSON Herald, MD;  Location: [facility] OR;  Service: Orthopedics;  Laterality: Left;    Social History:  reports that she has been smoking cigarettes. She has a 21 pack-year smoking history. She has never used smokeless tobacco. She reports current alcohol use. She reports that she does not use drugs.  Allergies:  Allergies  Allergen Reactions   Haldol [Haloperidol Lactate]     States mini seizures   Penicillins Itching    States the worst yeast infections ever.   Haldol [haloperidol lactate] and Penicillins   Family history:  History reviewed. No pertinent family history.   Physical Exam: Vitals:   12/22/23 1100 12/22/23 1115 12/22/23 1130 12/22/23 1351  BP: 112/78 114/74 120/76   Pulse: 62 62 (!) 58   Resp: (!) 23 (!) 22 20   Temp:    98.1 F (36.7 C)  TempSrc:    Oral  SpO2: 100% 100% 100%   Weight:      Height:       Wt Readings from Last 3 Encounters:  12/22/23 74.8 kg  12/25/22 68 kg  02/05/22 68 kg   Body mass index is 27.46 kg/m.  General exam: Pleasant, young African-American female. Skin: No rashes, lesions or ulcers. HEENT: Atraumatic, normocephalic, no obvious bleeding Lungs: Clear to auscultation bilaterally,  CVS: S1, S2, no murmur,   GI/Abd: Soft, mild epigastric tenderness, nondistended, bowel sound present,   CNS: Alert, awake, oriented x 3 Psychiatry:  Mood appropriate Extremities: No pedal edema, no calf tenderness,     ----------------------------------------------------------------------------------------------------------------------------------------- ----------------------------------------------------------------------------------------------------------------------------------------- -----------------------------------------------------------------------------------------------------------------------------------------  Assessment/Plan: Principal Problem:   Chest pain  Chest pain Elevated troponin Chest pain sounds atypical but with elevated troponin and a family history of heart disease, warrants to rule out ischemic heart disease. Seen by cardiology fellow last night in order for echocardiogram Seen by cardiology Dr. Waddell this morning.  He suspects patient's chest pain is likely due to esophagitis from high dose of ibuprofen .  No need to wait for echocardiogram inpatient.  Can discharge home and follow-up with cardiology as an outpatient for echo. Recent Labs    12/21/23 2201 12/22/23 0039 12/22/23 0651 12/22/23 0831  TROPONINIHS 27* 37* 25* 23*   Chronic back pain PTA meds- ibuprofen  800 mg 3 times daily PRN (without PPI protection), Robaxin  500 mg twice daily, Zanaflex  4 mg every 6 hours as needed, Lidoderm  patch Patient denies any GI symptoms,, reflux, epigastric pain except yesterday after taking 4 tablets of ibuprofen . I have started her on a 7-day course of Protonix to help heal esophagitis.    Recommendations at discharge:  Complete 7-day course of Protonix to help with esophagitis Follow-up with cardiology as an outpatient for echocardiogram Avoid high-dose of NSAIDs.  Recommend to switch to Tylenol  Stop smoking    Diet:  Diet Order             Diet regular Room service appropriate? Yes; Fluid consistency: Thin  Diet effective now           Diet general                   Nutritional status:  Body mass index is 27.46 kg/m.       Wounds:  -     Discharge Medications:   Allergies as of 12/22/2023       Reactions   Haldol [haloperidol Lactate]    States mini seizures   Penicillins Itching   States the worst yeast infections ever.        Medication List     STOP taking these medications    ibuprofen  200 MG tablet Commonly known as: ADVIL        TAKE these medications    acetaminophen  325 MG tablet Commonly known as: TYLENOL  Take 2 tablets (650 mg total) by mouth every 6 (six) hours as needed for mild pain (pain score 1-3) (or Fever >/= 101).   pantoprazole 40 MG tablet Commonly known as: Protonix Take 1 tablet (40 mg total) by mouth daily before breakfast for 7 days.         Follow ups:    Follow-up Information     [facility] Follow up.   Contact information: [address] [place]  [address] [phone]                Discharge Instructions:   Discharge Instructions     Ambulatory referral to Cardiology   Complete by: As directed    Call MD for:  difficulty breathing, headache or visual disturbances   Complete by: As directed    Call MD for:  extreme fatigue   Complete by: As directed    Call MD for:  hives   Complete by: As directed    Call MD for:  persistant dizziness or light-headedness   Complete by: As directed    Call MD for:  persistant nausea and vomiting   Complete by: As directed  Call MD for:  severe uncontrolled pain   Complete by: As directed    Call MD for:  temperature >100.4   Complete by: As directed    Diet general   Complete by: As directed    Discharge instructions   Complete by: As directed    Recommendations at discharge:   Complete 7-day course of Protonix to help with esophagitis  Follow-up with cardiology as an outpatient for echocardiogram  Avoid high-dose of NSAIDs.  Recommend to switch to Tylenol   Stop smoking  General discharge instructions: Follow with Primary MD  Patient, No Pcp Per in 7 days  Please request your PCP  to go over your hospital tests, procedures, radiology results at the follow up. Please get your medicines reviewed and adjusted.  Your PCP may decide to repeat certain labs or tests as needed. Do not drive, operate heavy machinery, perform activities at heights, swimming or participation in water activities or provide baby sitting services if your were admitted for syncope or siezures until you have seen by Primary MD or a Neurologist and advised to do so again. [place]  Controlled Substance Reporting System database was reviewed. Do not drive, operate heavy machinery, perform activities at heights, swim, participate in water activities or provide baby-sitting services while on medications for pain, sleep and mood until your outpatient physician has reevaluated you and advised to do so again.  You are strongly recommended to comply with the dose, frequency and duration of prescribed medications. Activity: As tolerated with Full fall precautions use walker/cane & assistance as needed Avoid using any recreational substances like cigarette, tobacco, alcohol, or non-prescribed drug. If you experience worsening of your admission symptoms, develop shortness of breath, life threatening emergency, suicidal or homicidal thoughts you must seek medical attention immediately by calling 911 or calling your MD immediately  if symptoms less severe. You must read complete instructions/literature along with all the possible adverse reactions/side effects for all the medicines you take and that have been prescribed to you. Take any new medicine only after you have completely understood and accepted all the possible adverse reactions/side effects.  Wear Seat belts while driving. You were cared for by a hospitalist during your hospital stay. If you have any questions about your discharge medications or the care you received while you were in the hospital after you  are discharged, you can call the unit and ask to speak with the hospitalist or the covering physician. Once you are discharged, your primary care physician will handle any further medical issues. Please note that NO REFILLS for any discharge medications will be authorized once you are discharged, as it is imperative that you return to your primary care physician (or establish a relationship with a primary care physician if you do not have one).   Increase activity slowly   Complete by: As directed         The results of significant diagnostics from this hospitalization (including imaging, microbiology, ancillary and laboratory) are listed below for reference.    Procedures and Diagnostic Studies:   CT Angio Chest PE W and/or Wo Contrast Result Date: 12/22/2023 EXAM: CTA CHEST 12/22/2023 04:07:20 AM TECHNIQUE: CTA of the chest was performed after the administration of intravenous contrast. Multiplanar reformatted images are provided for review. MIP images are provided for review. Automated exposure control, iterative reconstruction, and/or weight based adjustment of the mA/kV was utilized to reduce the radiation dose to as low as reasonably achievable. COMPARISON: None available. CLINICAL HISTORY: Pulmonary embolism (PE)  suspected, low to intermediate prob, neg D-dimer. FINDINGS: PULMONARY ARTERIES: Pulmonary arteries are adequately opacified for evaluation. No acute pulmonary embolus. Main pulmonary artery is normal in caliber. MEDIASTINUM: The heart and pericardium demonstrate no acute abnormality. There is no acute abnormality of the thoracic aorta. LYMPH NODES: No mediastinal, hilar or axillary lymphadenopathy. LUNGS AND PLEURA: The lungs are without acute process. No focal consolidation or pulmonary edema. No evidence of pleural effusion or pneumothorax. UPPER ABDOMEN: Limited images of the upper abdomen are unremarkable. SOFT TISSUES AND BONES: No acute bone or soft tissue abnormality. IMPRESSION:  1. No pulmonary embolism or acute pulmonary abnormality. Electronically signed by: Evalene Coho MD 12/22/2023 04:52 AM EDT RP Workstation: [id number]   DG Chest 2 View Result Date: 12/21/2023 EXAM: 2 VIEW(S) XRAY OF THE CHEST 12/21/2023 10:09:00 PM COMPARISON: 6 / 1 / 20:19 CLINICAL HISTORY: cp. Cp weakness FINDINGS: LUNGS AND PLEURA: No focal pulmonary opacity. No pulmonary edema. No pleural effusion. No pneumothorax. HEART AND MEDIASTINUM: No acute abnormality of the cardiac and mediastinal silhouettes. BONES AND SOFT TISSUES: No acute osseous abnormality. IMPRESSION: 1. Normal chest radiograph. Electronically signed by: Boomer Gatlin MD 12/21/2023 10:25 PM EDT RP Workstation: [id number]     Labs:   Basic Metabolic Panel: Recent Labs  Lab 12/21/23 2201  NA 136  K 3.9  CL 102  CO2 20*  GLUCOSE 96  BUN 7  CREATININE 1.13*  CALCIUM 9.5   GFR Estimated Creatinine Clearance: 69 mL/min (A) (by C-G formula based on SCr of 1.13 mg/dL (H)). Liver Function Tests: No results for input(s): AST, ALT, ALKPHOS, BILITOT, PROT, ALBUMIN in the last 168 hours. No results for input(s): LIPASE, AMYLASE in the last 168 hours. No results for input(s): AMMONIA in the last 168 hours. Coagulation profile No results for input(s): INR, PROTIME in the last 168 hours.  CBC: Recent Labs  Lab 12/21/23 2201  WBC 13.9*  HGB 15.4*  HCT 44.8  MCV 94.3  PLT 249   Cardiac Enzymes: No results for input(s): CKTOTAL, CKMB, CKMBINDEX, TROPONINI in the last 168 hours. BNP: Invalid input(s): POCBNP CBG: No results for input(s): GLUCAP in the last 168 hours. D-Dimer No results for input(s): DDIMER in the last 72 hours. Hgb A1c No results for input(s): HGBA1C in the last 72 hours. Lipid Profile No results for input(s): CHOL, HDL, LDLCALC, TRIG, CHOLHDL, LDLDIRECT in the last 72 hours. Thyroid function studies No results for input(s): TSH, T4TOTAL,  T3FREE, THYROIDAB in the last 72 hours.  Invalid input(s): FREET3 Anemia work up No results for input(s): VITAMINB12, FOLATE, FERRITIN, TIBC, IRON, RETICCTPCT in the last 72 hours. Microbiology No results found for this or any previous visit (from the past 240 hours).  Time coordinating discharge: 45 minutes  Signed: Anwitha Mapes  [facility] 12/22/2023, 3:23 PM

## 2023-12-22 NOTE — ED Notes (Signed)
 CCMD called and notified

## 2023-12-22 NOTE — ED Notes (Signed)
 Paper work reviewed with pt. Pt is in no new onset distress. Pt stated she has some pain and prn medicine was given however she did not want to seek further care. Pt is being taken to lobby and will use a bus pass.

## 2023-12-22 NOTE — ED Notes (Signed)
 Cards to see   Diana Jimenez HERO, PA-C 12/22/23 [id number]

## 2023-12-22 NOTE — ED Provider Notes (Signed)
 [place] EMERGENCY DEPARTMENT AT [facility] Provider Note   CSN: [id number] Arrival date & time: 12/21/23  2141     Patient presents with: Chest Pain   Arlene Lanigan is a 38 y.o. female.   The history is provided by the patient and medical records.  Chest Pain  38 year old female with history of depression, presenting to the ED with chest pain.  States yesterday she was doing her evening walk around 8 PM when she started getting sharp, stabbing, pleuritic type pain in her left ribs just below the breast.  Does radiate somewhat to the midsternal region.  States she got short of breath, vomited, and broke out in a sweat when this occurred.  She tried to rest but symptoms persisted.  She denies any prior cardiac history.  She is a daily cigarette smoker.  Family has history of hypertension but she has no formal diagnosis.  Denies any illicit drug use, specifically no cocaine.  No history of DVT or PE.  Denies any recent travel or prolonged immobilization.  Prior to Admission medications   Medication Sig Start Date End Date Taking? Authorizing Provider  clindamycin  (CLEOCIN ) 300 MG capsule Take 1 capsule (300 mg total) by mouth 3 (three) times daily. 04/13/22   Lynwood Lenis, PA-C  ibuprofen  (ADVIL ) 800 MG tablet Take 1 tablet (800 mg total) by mouth 3 (three) times daily. 04/13/22   Lynwood Lenis, PA-C  lidocaine  (LIDODERM ) 5 % Place 1 patch onto the skin daily. Remove & Discard patch within 12 hours or as directed by MD 05/19/20   Donah Riis A, PA-C  methocarbamol  (ROBAXIN ) 500 MG tablet Take 1 tablet (500 mg total) by mouth 2 (two) times daily. 02/05/22   Dasie Faden, MD  predniSONE  (STERAPRED UNI-PAK 21 TAB) 10 MG (21) TBPK tablet Take by mouth daily. Take 6 tabs by mouth daily  for 2 days, then 5 tabs for 2 days, then 4 tabs for 2 days, then 3 tabs for 2 days, 2 tabs for 2 days, then 1 tab by mouth daily for 2 days 02/05/22   Dasie Faden, MD  Probiotic Product  (PROBIOTIC DAILY) CAPS Take 1 capsule by mouth daily. 03/01/16   Wojeck, Robyn K, NP  tiZANidine  (ZANAFLEX ) 4 MG tablet Take 1 tablet (4 mg total) by mouth every 6 (six) hours as needed for muscle spasms. 01/18/21   Billy Asberry FALCON, PA-C    Allergies: Haldol [haloperidol lactate] and Penicillins    Review of Systems  Cardiovascular:  Positive for chest pain.  All other systems reviewed and are negative.   Updated Vital Signs BP (!) 149/109   Pulse 85   Temp 97.6 F (36.4 C) (Oral)   Resp 15   Ht 5' 5 (1.651 m)   Wt 74.8 kg   SpO2 100%   BMI 27.46 kg/m   Physical Exam Vitals and nursing note reviewed.  Constitutional:      Appearance: She is well-developed.  HENT:     Head: Normocephalic and atraumatic.  Eyes:     Conjunctiva/sclera: Conjunctivae normal.     Pupils: Pupils are equal, round, and reactive to light.  Cardiovascular:     Rate and Rhythm: Normal rate and regular rhythm.     Heart sounds: Normal heart sounds.  Pulmonary:     Effort: Pulmonary effort is normal.     Breath sounds: Normal breath sounds.  Abdominal:     General: Bowel sounds are normal.     Palpations: Abdomen is  soft.  Musculoskeletal:        General: Normal range of motion.     Cervical back: Normal range of motion.  Skin:    General: Skin is warm and dry.  Neurological:     Mental Status: She is alert and oriented to person, place, and time.     (all labs ordered are listed, but only abnormal results are displayed) Labs Reviewed  BASIC METABOLIC PANEL WITH GFR - Abnormal; Notable for the following components:      Result Value   CO2 20 (*)    Creatinine, Ser 1.13 (*)    All other components within normal limits  CBC - Abnormal; Notable for the following components:   WBC 13.9 (*)    Hemoglobin 15.4 (*)    All other components within normal limits  TROPONIN I (HIGH SENSITIVITY) - Abnormal; Notable for the following components:   Troponin I (High Sensitivity) 27 (*)    All  other components within normal limits  TROPONIN I (HIGH SENSITIVITY) - Abnormal; Notable for the following components:   Troponin I (High Sensitivity) 37 (*)    All other components within normal limits  HCG, SERUM, QUALITATIVE  TROPONIN I (HIGH SENSITIVITY)    EKG: None  Radiology: DG Chest 2 View Result Date: 12/21/2023 EXAM: 2 VIEW(S) XRAY OF THE CHEST 12/21/2023 10:09:00 PM COMPARISON: 6 / 1 / 20:19 CLINICAL HISTORY: cp. Cp weakness FINDINGS: LUNGS AND PLEURA: No focal pulmonary opacity. No pulmonary edema. No pleural effusion. No pneumothorax. HEART AND MEDIASTINUM: No acute abnormality of the cardiac and mediastinal silhouettes. BONES AND SOFT TISSUES: No acute osseous abnormality. IMPRESSION: 1. Normal chest radiograph. Electronically signed by: Sokoloski Gatlin MD 12/21/2023 10:25 PM EDT RP Workstation: [id number]     Procedures   Medications Ordered in the ED - No data to display                                  Medical Decision Making Amount and/or Complexity of Data Reviewed Labs: ordered. Radiology: ordered and independent interpretation performed. ECG/medicine tests: ordered and independent interpretation performed.  Risk Prescription drug management. Decision regarding hospitalization.   38 year old female presenting to the ED with chest pain.  Began yesterday around 8 PM while taking her evening walk.  Pain is sharp and stabbing in her left ribs.  Did report some nausea and episode of emesis along with diaphoresis.  She has no prior cardiac history.  She is a smoker.  EKG is nonischemic.  Workup initiated in triage--leukocytosis 13.9 but no fever or other infectious symptoms.  Electrolytes are normal.  Troponins are 27 and 37 respectively.  She has normal renal function.  Symptoms are atypical, do not feel this is necessarily ACS.  Considered PE given pleuritic nature of her symptoms, however PE study is negative for any acute findings.  Discussed with cardiology,  they will evaluate and give recommendations.  5:29 AM Cardiology, Dr. Otelia, has evaluated patient-- does not feel this is cardiac in nature.  Unclear etiology of troponin leak.  Recommends observation admission with medicine, plan for echo in the AM.  6:44 AM Awaiting call back from hospitalist for over an hour.  Handed off to oncoming team to discuss with them for admission.  Final diagnoses:  Chest pain, unspecified type  Elevated troponin    ED Discharge Orders     None  Jarold Olam HERO, PA-C 12/22/23 [id number]    Nettie, April, MD 12/22/23 [phone]

## 2023-12-22 NOTE — ED Notes (Signed)
 Patient transported to CT
